# Patient Record
Sex: Male | Born: 1940 | Race: Black or African American | Hispanic: No | Marital: Married | Smoking: Never smoker
Health system: Southern US, Community
[De-identification: ages and names within clinical notes are randomized; demographics above are authoritative.]

## PROBLEM LIST (undated history)

## (undated) DIAGNOSIS — E785 Hyperlipidemia, unspecified: Secondary | ICD-10-CM

## (undated) DIAGNOSIS — I10 Essential (primary) hypertension: Secondary | ICD-10-CM

## (undated) DIAGNOSIS — C801 Malignant (primary) neoplasm, unspecified: Secondary | ICD-10-CM

## (undated) DIAGNOSIS — Z87442 Personal history of urinary calculi: Secondary | ICD-10-CM

## (undated) DIAGNOSIS — I2510 Atherosclerotic heart disease of native coronary artery without angina pectoris: Secondary | ICD-10-CM

## (undated) DIAGNOSIS — E119 Type 2 diabetes mellitus without complications: Secondary | ICD-10-CM

## (undated) DIAGNOSIS — M1990 Unspecified osteoarthritis, unspecified site: Secondary | ICD-10-CM

## (undated) HISTORY — PX: JOINT REPLACEMENT: SHX530

## (undated) HISTORY — PX: EYE SURGERY: SHX253

## (undated) HISTORY — PX: CORONARY ANGIOPLASTY: SHX604

## (undated) HISTORY — PX: TONSILLECTOMY: SUR1361

## (undated) HISTORY — PX: COLON SURGERY: SHX602

## (undated) HISTORY — PX: BACK SURGERY: SHX140

## (undated) HISTORY — DX: Hyperlipidemia, unspecified: E78.5

## (undated) HISTORY — DX: Atherosclerotic heart disease of native coronary artery without angina pectoris: I25.10

## (undated) HISTORY — PX: BLEPHAROPLASTY: SUR158

## (undated) HISTORY — DX: Type 2 diabetes mellitus without complications: E11.9

## (undated) HISTORY — DX: Essential (primary) hypertension: I10

---

## 2014-06-11 DIAGNOSIS — H2513 Age-related nuclear cataract, bilateral: Secondary | ICD-10-CM | POA: Diagnosis not present

## 2014-06-11 DIAGNOSIS — L84 Corns and callosities: Secondary | ICD-10-CM | POA: Diagnosis not present

## 2014-06-11 DIAGNOSIS — I7090 Unspecified atherosclerosis: Secondary | ICD-10-CM | POA: Diagnosis not present

## 2014-06-11 DIAGNOSIS — Z85038 Personal history of other malignant neoplasm of large intestine: Secondary | ICD-10-CM | POA: Diagnosis not present

## 2014-06-11 DIAGNOSIS — B351 Tinea unguium: Secondary | ICD-10-CM | POA: Diagnosis not present

## 2014-06-11 DIAGNOSIS — C182 Malignant neoplasm of ascending colon: Secondary | ICD-10-CM | POA: Diagnosis not present

## 2014-06-11 DIAGNOSIS — E119 Type 2 diabetes mellitus without complications: Secondary | ICD-10-CM | POA: Diagnosis not present

## 2014-06-11 DIAGNOSIS — I798 Other disorders of arteries, arterioles and capillaries in diseases classified elsewhere: Secondary | ICD-10-CM | POA: Diagnosis not present

## 2014-06-11 DIAGNOSIS — H36 Retinal disorders in diseases classified elsewhere: Secondary | ICD-10-CM | POA: Diagnosis not present

## 2014-06-11 DIAGNOSIS — H02413 Mechanical ptosis of bilateral eyelids: Secondary | ICD-10-CM | POA: Diagnosis not present

## 2014-07-04 DIAGNOSIS — R311 Benign essential microscopic hematuria: Secondary | ICD-10-CM | POA: Diagnosis not present

## 2014-07-04 DIAGNOSIS — N400 Enlarged prostate without lower urinary tract symptoms: Secondary | ICD-10-CM | POA: Diagnosis not present

## 2014-07-26 DIAGNOSIS — D485 Neoplasm of uncertain behavior of skin: Secondary | ICD-10-CM | POA: Diagnosis not present

## 2014-07-31 DIAGNOSIS — D1801 Hemangioma of skin and subcutaneous tissue: Secondary | ICD-10-CM | POA: Diagnosis not present

## 2014-09-03 DIAGNOSIS — M25562 Pain in left knee: Secondary | ICD-10-CM | POA: Diagnosis not present

## 2014-09-03 DIAGNOSIS — M0579 Rheumatoid arthritis with rheumatoid factor of multiple sites without organ or systems involvement: Secondary | ICD-10-CM | POA: Diagnosis not present

## 2014-09-03 DIAGNOSIS — D649 Anemia, unspecified: Secondary | ICD-10-CM | POA: Diagnosis not present

## 2014-09-03 DIAGNOSIS — M2560 Stiffness of unspecified joint, not elsewhere classified: Secondary | ICD-10-CM | POA: Diagnosis not present

## 2014-09-03 DIAGNOSIS — M810 Age-related osteoporosis without current pathological fracture: Secondary | ICD-10-CM | POA: Diagnosis not present

## 2014-09-04 DIAGNOSIS — E780 Pure hypercholesterolemia: Secondary | ICD-10-CM | POA: Diagnosis not present

## 2014-09-04 DIAGNOSIS — I119 Hypertensive heart disease without heart failure: Secondary | ICD-10-CM | POA: Diagnosis not present

## 2014-09-04 DIAGNOSIS — Z6832 Body mass index (BMI) 32.0-32.9, adult: Secondary | ICD-10-CM | POA: Diagnosis not present

## 2014-09-04 DIAGNOSIS — E1165 Type 2 diabetes mellitus with hyperglycemia: Secondary | ICD-10-CM | POA: Diagnosis not present

## 2014-09-04 DIAGNOSIS — Z79899 Other long term (current) drug therapy: Secondary | ICD-10-CM | POA: Diagnosis not present

## 2014-09-04 DIAGNOSIS — E119 Type 2 diabetes mellitus without complications: Secondary | ICD-10-CM | POA: Diagnosis not present

## 2014-09-17 DIAGNOSIS — M0579 Rheumatoid arthritis with rheumatoid factor of multiple sites without organ or systems involvement: Secondary | ICD-10-CM | POA: Diagnosis not present

## 2014-09-17 DIAGNOSIS — R5383 Other fatigue: Secondary | ICD-10-CM | POA: Diagnosis not present

## 2014-09-17 DIAGNOSIS — Z79899 Other long term (current) drug therapy: Secondary | ICD-10-CM | POA: Diagnosis not present

## 2014-09-19 DIAGNOSIS — B351 Tinea unguium: Secondary | ICD-10-CM | POA: Diagnosis not present

## 2014-09-19 DIAGNOSIS — L84 Corns and callosities: Secondary | ICD-10-CM | POA: Diagnosis not present

## 2014-09-19 DIAGNOSIS — I798 Other disorders of arteries, arterioles and capillaries in diseases classified elsewhere: Secondary | ICD-10-CM | POA: Diagnosis not present

## 2014-09-19 DIAGNOSIS — I7090 Unspecified atherosclerosis: Secondary | ICD-10-CM | POA: Diagnosis not present

## 2014-12-31 DIAGNOSIS — I798 Other disorders of arteries, arterioles and capillaries in diseases classified elsewhere: Secondary | ICD-10-CM | POA: Diagnosis not present

## 2014-12-31 DIAGNOSIS — R0602 Shortness of breath: Secondary | ICD-10-CM | POA: Diagnosis not present

## 2014-12-31 DIAGNOSIS — B351 Tinea unguium: Secondary | ICD-10-CM | POA: Diagnosis not present

## 2014-12-31 DIAGNOSIS — R9431 Abnormal electrocardiogram [ECG] [EKG]: Secondary | ICD-10-CM | POA: Diagnosis not present

## 2014-12-31 DIAGNOSIS — E784 Other hyperlipidemia: Secondary | ICD-10-CM | POA: Diagnosis not present

## 2014-12-31 DIAGNOSIS — I10 Essential (primary) hypertension: Secondary | ICD-10-CM | POA: Diagnosis not present

## 2014-12-31 DIAGNOSIS — R0789 Other chest pain: Secondary | ICD-10-CM | POA: Diagnosis not present

## 2014-12-31 DIAGNOSIS — L84 Corns and callosities: Secondary | ICD-10-CM | POA: Diagnosis not present

## 2014-12-31 DIAGNOSIS — I7090 Unspecified atherosclerosis: Secondary | ICD-10-CM | POA: Diagnosis not present

## 2014-12-31 DIAGNOSIS — I2510 Atherosclerotic heart disease of native coronary artery without angina pectoris: Secondary | ICD-10-CM | POA: Diagnosis not present

## 2015-01-02 DIAGNOSIS — E1165 Type 2 diabetes mellitus with hyperglycemia: Secondary | ICD-10-CM | POA: Diagnosis not present

## 2015-01-02 DIAGNOSIS — Z6832 Body mass index (BMI) 32.0-32.9, adult: Secondary | ICD-10-CM | POA: Diagnosis not present

## 2015-01-02 DIAGNOSIS — I2510 Atherosclerotic heart disease of native coronary artery without angina pectoris: Secondary | ICD-10-CM | POA: Diagnosis not present

## 2015-01-02 DIAGNOSIS — I119 Hypertensive heart disease without heart failure: Secondary | ICD-10-CM | POA: Diagnosis not present

## 2015-01-02 DIAGNOSIS — G4700 Insomnia, unspecified: Secondary | ICD-10-CM | POA: Diagnosis not present

## 2015-01-02 DIAGNOSIS — E780 Pure hypercholesterolemia: Secondary | ICD-10-CM | POA: Diagnosis not present

## 2015-01-02 DIAGNOSIS — Z79899 Other long term (current) drug therapy: Secondary | ICD-10-CM | POA: Diagnosis not present

## 2015-01-03 DIAGNOSIS — C182 Malignant neoplasm of ascending colon: Secondary | ICD-10-CM | POA: Diagnosis not present

## 2015-01-07 DIAGNOSIS — H36 Retinal disorders in diseases classified elsewhere: Secondary | ICD-10-CM | POA: Diagnosis not present

## 2015-01-07 DIAGNOSIS — E119 Type 2 diabetes mellitus without complications: Secondary | ICD-10-CM | POA: Diagnosis not present

## 2015-01-07 DIAGNOSIS — H02413 Mechanical ptosis of bilateral eyelids: Secondary | ICD-10-CM | POA: Diagnosis not present

## 2015-03-13 DIAGNOSIS — Z23 Encounter for immunization: Secondary | ICD-10-CM | POA: Diagnosis not present

## 2015-03-25 DIAGNOSIS — I798 Other disorders of arteries, arterioles and capillaries in diseases classified elsewhere: Secondary | ICD-10-CM | POA: Diagnosis not present

## 2015-03-25 DIAGNOSIS — M0579 Rheumatoid arthritis with rheumatoid factor of multiple sites without organ or systems involvement: Secondary | ICD-10-CM | POA: Diagnosis not present

## 2015-03-25 DIAGNOSIS — B351 Tinea unguium: Secondary | ICD-10-CM | POA: Diagnosis not present

## 2015-03-25 DIAGNOSIS — M810 Age-related osteoporosis without current pathological fracture: Secondary | ICD-10-CM | POA: Diagnosis not present

## 2015-03-25 DIAGNOSIS — L84 Corns and callosities: Secondary | ICD-10-CM | POA: Diagnosis not present

## 2015-03-25 DIAGNOSIS — K759 Inflammatory liver disease, unspecified: Secondary | ICD-10-CM | POA: Diagnosis not present

## 2015-03-25 DIAGNOSIS — D649 Anemia, unspecified: Secondary | ICD-10-CM | POA: Diagnosis not present

## 2015-03-25 DIAGNOSIS — R5383 Other fatigue: Secondary | ICD-10-CM | POA: Diagnosis not present

## 2015-03-25 DIAGNOSIS — I7090 Unspecified atherosclerosis: Secondary | ICD-10-CM | POA: Diagnosis not present

## 2015-04-09 DIAGNOSIS — R5383 Other fatigue: Secondary | ICD-10-CM | POA: Diagnosis not present

## 2015-04-09 DIAGNOSIS — M0579 Rheumatoid arthritis with rheumatoid factor of multiple sites without organ or systems involvement: Secondary | ICD-10-CM | POA: Diagnosis not present

## 2015-04-09 DIAGNOSIS — I2510 Atherosclerotic heart disease of native coronary artery without angina pectoris: Secondary | ICD-10-CM | POA: Diagnosis not present

## 2015-04-09 DIAGNOSIS — E784 Other hyperlipidemia: Secondary | ICD-10-CM | POA: Diagnosis not present

## 2015-04-09 DIAGNOSIS — I10 Essential (primary) hypertension: Secondary | ICD-10-CM | POA: Diagnosis not present

## 2015-04-09 DIAGNOSIS — R0602 Shortness of breath: Secondary | ICD-10-CM | POA: Diagnosis not present

## 2015-04-09 DIAGNOSIS — R0789 Other chest pain: Secondary | ICD-10-CM | POA: Diagnosis not present

## 2015-04-09 DIAGNOSIS — R9431 Abnormal electrocardiogram [ECG] [EKG]: Secondary | ICD-10-CM | POA: Diagnosis not present

## 2015-04-09 DIAGNOSIS — E559 Vitamin D deficiency, unspecified: Secondary | ICD-10-CM | POA: Diagnosis not present

## 2015-05-08 ENCOUNTER — Encounter: Payer: Self-pay | Admitting: Endocrinology

## 2015-05-08 ENCOUNTER — Ambulatory Visit (INDEPENDENT_AMBULATORY_CARE_PROVIDER_SITE_OTHER): Payer: Medicare Other | Admitting: Endocrinology

## 2015-05-08 VITALS — BP 140/80 | HR 61 | Temp 98.6°F | Ht 74.0 in | Wt 245.0 lb

## 2015-05-08 DIAGNOSIS — E119 Type 2 diabetes mellitus without complications: Secondary | ICD-10-CM | POA: Insufficient documentation

## 2015-05-08 DIAGNOSIS — I2510 Atherosclerotic heart disease of native coronary artery without angina pectoris: Secondary | ICD-10-CM

## 2015-05-08 DIAGNOSIS — E1159 Type 2 diabetes mellitus with other circulatory complications: Secondary | ICD-10-CM | POA: Diagnosis not present

## 2015-05-08 LAB — POCT GLYCOSYLATED HEMOGLOBIN (HGB A1C): Hemoglobin A1C: 10.7

## 2015-05-08 MED ORDER — SITAGLIPTIN PHOSPHATE 100 MG PO TABS: 100.0000 mg | ORAL_TABLET | Freq: Every day | ORAL | Status: DC

## 2015-05-08 MED ORDER — CANAGLIFLOZIN 300 MG PO TABS: 300.0000 mg | ORAL_TABLET | Freq: Every day | ORAL | Status: DC

## 2015-05-08 MED ORDER — METFORMIN HCL ER 500 MG PO TB24: 500.0000 mg | ORAL_TABLET | Freq: Every day | ORAL | Status: DC

## 2015-05-08 MED ORDER — GLIMEPIRIDE 4 MG PO TABS: 4.0000 mg | ORAL_TABLET | Freq: Every day | ORAL | Status: DC

## 2015-05-08 NOTE — Patient Instructions (Addendum)
good diet and exercise significantly improve the control of your diabetes.  please let me know if you wish to be referred to a dietician.  high blood sugar is very risky to your health.  you should see an eye doctor and dentist every year.  It is very important to get all recommended vaccinations.  controlling your blood pressure and cholesterol drastically reduces the damage diabetes does to your body.  Those who smoke should quit.  please discuss these with your doctor.  check your blood sugar once a day.  vary the time of day when you check, between before the 3 meals, and at bedtime.  also check if you have symptoms of your blood sugar being too high or too low.  please keep a record of the readings and bring it to your next appointment here (or you can bring the meter itself).  You can write it on any piece of paper.  please call us sooner if your blood sugar goes below 70, or if you have a lot of readings over 200. please call [phone] ([place] physician referral line), to get an appointment with a new primary doctor.  You get your blood sugar better, we'll need to add several other medications: please add 1 per week:  i have sent prescriptions to your pharmacy, to first add a small amount of exteneded-release metformin.  Then please add glimepiride, invokana, and januvia, adding 1 per week, until your blood sugar come down to the low-100's.   Please see a heart specialist.  you will receive a phone call, about a day and time for an appointment. Please come back for a follow-up appointment in 1 month.

## 2015-05-08 NOTE — Progress Notes (Signed)
Subjective:    Patient ID: Luis Hammond, male    DOB: 06/19/1940, 74 y.o.   MRN: [id number]  HPI pt states DM was dx'ed in 1991; he has mild if any neuropathy of the lower extremities, but he has associated CAD; he has never been on insulin; pt says his diet is good, but exercise is not good; he has never had pancreatitis, severe hypoglycemia or DKA.  He did not tolerate metformin (diarrhea).  Meter is downloaded today, and the printout is scanned into the record.  cbg varies from 170-500.  There is no trend throughout the day.  Past Medical History  Diagnosis Date  . Coronary artery disease   . Hypertension   . Hyperlipidemia   . Diabetes ([place])     No past surgical history on file.  Social History   Social History  . Marital Status: Married    Spouse Name: N/A  . Number of Children: N/A  . Years of Education: N/A   Occupational History  . Not on file.   Social History Main Topics  . Smoking status: Never Smoker   . Smokeless tobacco: Never Used  . Alcohol Use: No  . Drug Use: Not on file  . Sexual Activity: Not on file   Other Topics Concern  . Not on file   Social History Narrative    No current outpatient prescriptions on file prior to visit.   No current facility-administered medications on file prior to visit.    Allergies  Allergen Reactions  . Other Hives and Swelling    Allergen: Onion  . Ivp Dye [Iodinated Diagnostic Agents]   . Penicillins     Family History  Problem Relation Age of Onset  . Diabetes Father   . Diabetes Sister   . Diabetes Brother   . Other Brother     SIGHT ISSUES  . Heart disease Mother   . Dementia Mother   . Diabetes Sister   . Kidney disease Sister   . Diabetes Sister     BP 140/80 mmHg  Pulse 61  Temp(Src) 98.6 F (37 C) (Oral)  Ht 6\' 2"  (1.88 m)  Wt 245 lb (111.131 kg)  BMI 31.44 kg/m2  SpO2 97%  Review of Systems denies weight loss, blurry vision, headache, chest pain, sob, n/v, muscle cramps,  excessive diaphoresis, depression, cold intolerance, rhinorrhea, and easy bruising.  He has urinary frequency.     Objective:   Physical Exam VS: see vs page GEN: no distress HEAD: head: no deformity eyes: no periorbital swelling, no proptosis external nose and ears are normal mouth: no lesion seen NECK: supple, thyroid is not enlarged CHEST WALL: no deformity LUNGS: clear to auscultation BREASTS:  No gynecomastia CV: reg rate and rhythm, no murmur ABD: abdomen is soft, nontender.  no hepatosplenomegaly.  not distended.  no hernia MUSCULOSKELETAL: muscle bulk and strength are grossly normal.  no obvious joint swelling.  gait is normal and steady EXTEMITIES: no deformity.  no ulcer on the feet.  feet are of normal color and temp.  no edema PULSES: dorsalis pedis intact bilat.  no carotid bruit NEURO:  cn 2-12 grossly intact.   readily moves all 4's.  sensation is intact to touch on the feet SKIN:  Normal texture and temperature.  No rash or suspicious lesion is visible.   NODES:  None palpable at the neck.  PSYCH: alert, well-oriented.  Does not appear anxious nor depressed.   A1c=10.7%  i personally reviewed electrocardiogram tracing (  today):  Indication: CAD Impression: Frequent PACs -atrial bigeminy.-Incomplete left bundle branch block. Old inferior infarct.     Assessment & Plan:  DM: severe exacerbation.  He wants to avoid insulin if possible.  I told him he may still need insulin, but we'll try.   Urinary frequency: new to me.  I told pt this will improve as glucose does. CAD: he needs to get established with cardiol.  Dyslipidemia: on rx. HTN: on rx  Patient is advised the following: Patient Instructions  good diet and exercise significantly improve the control of your diabetes.  please let me know if you wish to be referred to a dietician.  high blood sugar is very risky to your health.  you should see an eye doctor and dentist every year.  It is very important to get  all recommended vaccinations.  controlling your blood pressure and cholesterol drastically reduces the damage diabetes does to your body.  Those who smoke should quit.  please discuss these with your doctor.  check your blood sugar once a day.  vary the time of day when you check, between before the 3 meals, and at bedtime.  also check if you have symptoms of your blood sugar being too high or too low.  please keep a record of the readings and bring it to your next appointment here (or you can bring the meter itself).  You can write it on any piece of paper.  please call us sooner if your blood sugar goes below 70, or if you have a lot of readings over 200. please call [phone] ([place] physician referral line), to get an appointment with a new primary doctor.  You get your blood sugar better, we'll need to add several other medications: please add 1 per week:  i have sent prescriptions to your pharmacy, to first add a small amount of exteneded-release metformin.  Then please add glimepiride, invokana, and januvia, adding 1 per week, until your blood sugar come down to the low-100's.   Please see a heart specialist.  you will receive a phone call, about a day and time for an appointment. Please come back for a follow-up appointment in 1 month.

## 2015-05-09 ENCOUNTER — Ambulatory Visit (INDEPENDENT_AMBULATORY_CARE_PROVIDER_SITE_OTHER): Payer: Medicare Other | Admitting: Cardiovascular Disease

## 2015-05-09 ENCOUNTER — Encounter: Payer: Self-pay | Admitting: Cardiovascular Disease

## 2015-05-09 VITALS — BP 140/82 | HR 64 | Ht 74.0 in | Wt 247.3 lb

## 2015-05-09 DIAGNOSIS — I2510 Atherosclerotic heart disease of native coronary artery without angina pectoris: Secondary | ICD-10-CM | POA: Diagnosis not present

## 2015-05-09 DIAGNOSIS — Z79899 Other long term (current) drug therapy: Secondary | ICD-10-CM

## 2015-05-09 DIAGNOSIS — E785 Hyperlipidemia, unspecified: Secondary | ICD-10-CM | POA: Insufficient documentation

## 2015-05-09 DIAGNOSIS — I10 Essential (primary) hypertension: Secondary | ICD-10-CM | POA: Insufficient documentation

## 2015-05-09 NOTE — Patient Instructions (Signed)
Medication Instructions:  Please continue your current medications  Labwork: Your physician recommends that you return for lab work in at your earliest [place].  Testing/Procedures: NONE  Follow-Up: Dr Gwenlyn Found recommends that you schedule a follow-up appointment in 1 year. You will receive a reminder letter in the mail two months in advance. If you don't receive a letter, please call our office to schedule the follow-up appointment.  If you need a refill on your cardiac medications before your next appointment, please call your pharmacy.

## 2015-05-09 NOTE — Assessment & Plan Note (Addendum)
History of CAD status post myocardial infarction and stenting X 2 in 2001 in [place]. He has had a workup by his cardiologist as recently as last month with a 2-D echo and a stress test within the last year. He denies chest pain or shortness of breath.

## 2015-05-09 NOTE — Assessment & Plan Note (Signed)
History of hyperlipidemia, statin intolerant on Zetia. We will recheck a lipid and liver profile

## 2015-05-09 NOTE — Progress Notes (Signed)
05/09/2015 Luis Hammond   February 10, 1941  [id number]  Primary Physician No primary care provider on file. Primary Cardiologist: Luis Harp MD Luis Hammond   HPI:  [name] is a 74 year old moderately overweight married African-American male father of 2, grandfather of 5 grandchildren referred by Dr. Loanne Hammond for cardiovascular evaluation. He recently relocated [place] one year ago. 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 His cardiac risk factor profile is notable for treated hypertension, hypokalemia and diabetes. He did have a myocardial infarction 2001 and had stenting at that time. He has never had a stroke. His problems include rheumatoid arthritis. He denies chest pain or shortness of breath.   Current Outpatient Prescriptions  Medication Sig Dispense Refill  . amLODipine (NORVASC) 10 MG tablet Take 10 mg by mouth daily.    Marland Kitchen aspirin 81 MG tablet Take 1 mg by mouth daily.    . canagliflozin (INVOKANA) 300 MG TABS tablet Take 300 mg by mouth daily. 30 tablet 11  . Cholecalciferol (VITAMIN D-3) 1000 UNITS CAPS Take 2,000 Units by mouth daily.     Marland Kitchen ezetimibe (ZETIA) 10 MG tablet Take 10 mg by mouth daily.    . ferrous sulfate 325 (65 FE) MG tablet Take 325 mg by mouth daily with breakfast.    . folic acid (FOLVITE) 1 MG tablet Take 1 mg by mouth daily.    Marland Kitchen glimepiride (AMARYL) 4 MG tablet Take 1 tablet (4 mg total) by mouth daily before breakfast. 30 tablet 3  . losartan (COZAAR) 100 MG tablet Take 100 mg by mouth daily.    . metFORMIN (GLUCOPHAGE-XR) 500 MG 24 hr tablet Take 1 tablet (500 mg total) by mouth daily with breakfast. 30 tablet 11  . methotrexate (RHEUMATREX) 2.5 MG tablet Take 2.5 mg by mouth once a week. Caution:Chemotherapy. Protect from light.    . metoprolol succinate (TOPROL-XL) 25 MG 24 hr tablet Take 25 mg by mouth daily.    . pioglitazone (ACTOS) 30 MG tablet Take 30 mg by mouth daily.    . sitaGLIPtin (JANUVIA) 100 MG  tablet Take 1 tablet (100 mg total) by mouth daily. 30 tablet 1   No current facility-administered medications for this visit.    Allergies  Allergen Reactions  . Other Hives and Swelling    Allergen: Onion  . Ivp Dye [Iodinated Diagnostic Agents]   . Penicillins     Social History   Social History  . Marital Status: Married    Spouse Name: N/A  . Number of Children: N/A  . Years of Education: N/A   Occupational History  . Not on file.   Social History Main Topics  . Smoking status: Never Smoker   . Smokeless tobacco: Never Used  . Alcohol Use: No  . Drug Use: Not on file  . Sexual Activity: Not on file   Other Topics Concern  . Not on file   Social History Narrative     Review of Systems: General: negative for chills, fever, night sweats or weight changes.  Cardiovascular: negative for chest pain, dyspnea on exertion, edema, orthopnea, palpitations, paroxysmal nocturnal dyspnea or shortness of breath Dermatological: negative for rash Respiratory: negative for cough or wheezing Urologic: negative for hematuria Abdominal: negative for nausea, vomiting, diarrhea, bright red blood per rectum, melena, or hematemesis Neurologic: negative for visual changes, syncope, or dizziness All other systems reviewed and are otherwise negative except as noted above.    Blood pressure 140/82, pulse  64, height 6\' 2"  (1.88 m), weight 247 lb 4.8 oz (112.175 kg).  General appearance: alert and no distress Neck: no adenopathy, no carotid bruit, no JVD, supple, symmetrical, trachea midline and thyroid not enlarged, symmetric, no tenderness/mass/nodules Lungs: clear to auscultation bilaterally Heart: regular rate and rhythm, S1, S2 normal, no murmur, click, rub or gallop Extremities: extremities normal, atraumatic, no cyanosis or edema  EKG normal sinus rhythm at 64 with sinus arrhythmia. I personally reviewed this EKG  ASSESSMENT AND PLAN:   Hyperlipidemia History of  hyperlipidemia, statin intolerant on Zetia. We will recheck a lipid and liver profile  Essential hypertension History of hypertension blood pressure measured today 140/82. He is on amlodipine, losartan and metoprolol. Continue current meds at current dosing  CAD (coronary artery disease) History of CAD status post myocardial infarction and stenting X 2 in 2001 in [place]. He has had a workup by his cardiologist as recently as last month with a 2-D echo and a stress test within the last year. He denies chest pain or shortness of breath.      Luis Harp MD FACP,FACC,FAHA, [facility] 05/09/2015 4:29 PM

## 2015-05-09 NOTE — Assessment & Plan Note (Signed)
History of hypertension blood pressure measured today 140/82. He is on amlodipine, losartan and metoprolol. Continue current meds at current dosing

## 2015-05-10 ENCOUNTER — Encounter: Payer: Self-pay | Admitting: Cardiovascular Disease

## 2015-05-10 DIAGNOSIS — Z7689 Persons encountering health services in other specified circumstances: Secondary | ICD-10-CM

## 2015-05-14 ENCOUNTER — Telehealth: Payer: Self-pay | Admitting: Endocrinology

## 2015-05-14 DIAGNOSIS — Z7689 Persons encountering health services in other specified circumstances: Secondary | ICD-10-CM

## 2015-05-14 MED ORDER — EMPAGLIFLOZIN 25 MG PO TABS: 25.0000 mg | ORAL_TABLET | Freq: Every day | ORAL | Status: DC

## 2015-05-14 NOTE — Telephone Encounter (Signed)
please call patient: Ins wants you to change invokana to jardiance. i have sent a prescription to your pharmacy

## 2015-05-15 NOTE — Telephone Encounter (Signed)
Left a voicemail advising of note below. Requested a call back if the pt would like to discuss.  

## 2015-05-17 ENCOUNTER — Ambulatory Visit: Payer: Medicare Other | Admitting: Endocrinology

## 2015-06-03 DIAGNOSIS — Z79899 Other long term (current) drug therapy: Secondary | ICD-10-CM | POA: Diagnosis not present

## 2015-06-03 DIAGNOSIS — E785 Hyperlipidemia, unspecified: Secondary | ICD-10-CM | POA: Diagnosis not present

## 2015-06-04 LAB — HEPATIC FUNCTION PANEL
ALK PHOS: 47 U/L (ref 40–115)
ALT: 17 U/L (ref 9–46)
AST: 18 U/L (ref 10–35)
Albumin: 4.3 g/dL (ref 3.6–5.1)
BILIRUBIN DIRECT: 0.1 mg/dL (ref <=0.2)
BILIRUBIN TOTAL: 0.3 mg/dL (ref 0.2–1.2)
Indirect Bilirubin: 0.2 mg/dL (ref 0.2–1.2)
Total Protein: 6.4 g/dL (ref 6.1–8.1)

## 2015-06-04 LAB — LIPID PANEL
CHOL/HDL RATIO: 3.5 ratio (ref <=5.0)
CHOLESTEROL: 139 mg/dL (ref 125–200)
HDL: 40 mg/dL (ref >=40)
LDL Cholesterol: 71 mg/dL (ref <130)
Triglycerides: 140 mg/dL (ref <150)
VLDL: 28 mg/dL (ref <30)

## 2015-06-07 ENCOUNTER — Ambulatory Visit (INDEPENDENT_AMBULATORY_CARE_PROVIDER_SITE_OTHER): Payer: Medicare Other | Admitting: Endocrinology

## 2015-06-07 ENCOUNTER — Encounter: Payer: Self-pay | Admitting: Endocrinology

## 2015-06-07 VITALS — BP 144/94 | HR 55 | Temp 98.2°F | Ht 74.0 in | Wt 248.0 lb

## 2015-06-07 DIAGNOSIS — E1159 Type 2 diabetes mellitus with other circulatory complications: Secondary | ICD-10-CM | POA: Diagnosis not present

## 2015-06-07 LAB — MICROALBUMIN / CREATININE URINE RATIO
Creatinine,U: 96.8 mg/dL
MICROALB/CREAT RATIO: 0.7 mg/g (ref 0.0–30.0)

## 2015-06-07 LAB — BASIC METABOLIC PANEL
BUN: 16 mg/dL (ref 6–23)
CALCIUM: 9.4 mg/dL (ref 8.4–10.5)
CO2: 28 meq/L (ref 19–32)
Chloride: 109 mEq/L (ref 96–112)
Creatinine, Ser: 1.1 mg/dL (ref 0.40–1.50)
GFR: 84 mL/min (ref >60.00)
Glucose, Bld: 95 mg/dL (ref 70–99)
POTASSIUM: 4.4 meq/L (ref 3.5–5.1)
SODIUM: 142 meq/L (ref 135–145)

## 2015-06-07 MED ORDER — METFORMIN HCL ER 500 MG PO TB24: 500.0000 mg | ORAL_TABLET | Freq: Every day | ORAL | Status: DC

## 2015-06-07 NOTE — Progress Notes (Signed)
Subjective:    Patient ID: Luis Hammond, male    DOB: November 06, 1940, 75 y.o.   MRN: [id number]  HPI Pt returns for f/u of diabetes mellitus: DM type: 2 Dx'ed: 99991111 Complications: CAD Therapy: 5 oral meds DKA: never Severe hypoglycemia: never Pancreatitis: never Other: he has never been on insulin Interval history: no cbg record, but states cbg's vary from 86-150.  pt states he feels well in general, except for a slight headache. He feels as though he needs to eat at hs, in order to avoid nocturnal hypoglycemia.  He has slight diarrhea.  Past Medical History  Diagnosis Date  . Coronary artery disease   . Hypertension   . Hyperlipidemia   . Diabetes ([place])     No past surgical history on file.  Social History   Social History  . Marital Status: Married    Spouse Name: N/A  . Number of Children: N/A  . Years of Education: N/A   Occupational History  . Not on file.   Social History Main Topics  . Smoking status: Never Smoker   . Smokeless tobacco: Never Used  . Alcohol Use: No  . Drug Use: Not on file  . Sexual Activity: Not on file   Other Topics Concern  . Not on file   Social History Narrative   Epworth Sleepiness Scale - 7 (as of 05/09/15)    Current Outpatient Prescriptions on File Prior to Visit  Medication Sig Dispense Refill  . amLODipine (NORVASC) 10 MG tablet Take 10 mg by mouth daily.    Marland Kitchen aspirin 81 MG tablet Take 1 mg by mouth daily.    . Cholecalciferol (VITAMIN D-3) 1000 UNITS CAPS Take 2,000 Units by mouth daily.     Marland Kitchen ezetimibe (ZETIA) 10 MG tablet Take 10 mg by mouth daily.    . ferrous sulfate 325 (65 FE) MG tablet Take 325 mg by mouth daily with breakfast.    . folic acid (FOLVITE) 1 MG tablet Take 1 mg by mouth daily.    Marland Kitchen losartan (COZAAR) 100 MG tablet Take 100 mg by mouth daily.    . methotrexate (RHEUMATREX) 2.5 MG tablet Take 2.5 mg by mouth once a week. Caution:Chemotherapy. Protect from light.    . metoprolol succinate (TOPROL-XL)  25 MG 24 hr tablet Take 25 mg by mouth daily.     No current facility-administered medications on file prior to visit.    Allergies  Allergen Reactions  . Other Hives and Swelling    Allergen: Onion  . Ivp Dye [Iodinated Diagnostic Agents]   . Penicillins     Family History  Problem Relation Age of Onset  . Diabetes Father   . Diabetes Sister   . Diabetes Brother   . Other Brother     SIGHT ISSUES  . Heart disease Mother   . Dementia Mother   . Diabetes Sister   . Kidney disease Sister   . Diabetes Sister     BP 144/94 mmHg  Pulse 55  Temp(Src) 98.2 F (36.8 C) (Oral)  Ht 6\' 2"  (1.88 m)  Wt 248 lb (112.492 kg)  BMI 31.83 kg/m2  SpO2 98%  Review of Systems He denies hypoglycemia    Objective:   Physical Exam VITAL SIGNS:  See vs page GENERAL: no distress Ext: no edema.         Assessment & Plan:  DM: control is apparently improved.  We'll check fructosamine to verify. Diarrhea: we can't increase metformin.  Patient is advised the following: Patient Instructions  check your blood sugar once a day.  vary the time of day when you check, between before the 3 meals, and at bedtime.  also check if you have symptoms of your blood sugar being too high or too low.  please keep a record of the readings and bring it to your next appointment here (or you can bring the meter itself).  You can write it on any piece of paper.  please call us sooner if your blood sugar goes below 70, or if you have a lot of readings over 200. please call [phone] ([place] physician referral line), to get an appointment with a new primary doctor.   blood tests are requested for you today.  We'll let you know about the results.  Please come back for a follow-up appointment in 2 months.

## 2015-06-07 NOTE — Patient Instructions (Addendum)
check your blood sugar once a day.  vary the time of day when you check, between before the 3 meals, and at bedtime.  also check if you have symptoms of your blood sugar being too high or too low.  please keep a record of the readings and bring it to your next appointment here (or you can bring the meter itself).  You can write it on any piece of paper.  please call us sooner if your blood sugar goes below 70, or if you have a lot of readings over 200. please call [phone] ([place] physician referral line), to get an appointment with a new primary doctor.   blood tests are requested for you today.  We'll let you know about the results.  Please come back for a follow-up appointment in 2 months.

## 2015-06-08 MED ORDER — EMPAGLIFLOZIN 25 MG PO TABS: 25.0000 mg | ORAL_TABLET | Freq: Every day | ORAL | Status: AC

## 2015-06-08 MED ORDER — PIOGLITAZONE HCL 30 MG PO TABS: 30.0000 mg | ORAL_TABLET | Freq: Every day | ORAL | Status: DC

## 2015-06-08 MED ORDER — GLIMEPIRIDE 4 MG PO TABS: 4.0000 mg | ORAL_TABLET | Freq: Every day | ORAL | Status: DC

## 2015-06-08 MED ORDER — SITAGLIPTIN PHOSPHATE 100 MG PO TABS: 100.0000 mg | ORAL_TABLET | Freq: Every day | ORAL | Status: DC

## 2015-06-10 ENCOUNTER — Other Ambulatory Visit: Payer: Self-pay | Admitting: Endocrinology

## 2015-06-10 LAB — FRUCTOSAMINE: Fructosamine: 274 umol/L — ABNORMAL HIGH (ref 190–270)

## 2015-06-10 MED ORDER — GLIMEPIRIDE 2 MG PO TABS: 2.0000 mg | ORAL_TABLET | Freq: Every day | ORAL | Status: DC

## 2015-06-13 ENCOUNTER — Telehealth: Payer: Self-pay | Admitting: Cardiovascular Disease

## 2015-06-13 NOTE — Telephone Encounter (Signed)
Mr.Jeansonne is calling about his test results. Please call   Thanks

## 2015-06-13 NOTE — Telephone Encounter (Signed)
Pt informed of results and voiced understanding. No further concerns at this time. Advised to call if any other needs.

## 2015-06-20 ENCOUNTER — Telehealth: Payer: Self-pay

## 2015-06-20 NOTE — Telephone Encounter (Signed)
Faxed records release to [facility] (Dr Reece Leader) in [place]

## 2015-07-15 DIAGNOSIS — N400 Enlarged prostate without lower urinary tract symptoms: Secondary | ICD-10-CM | POA: Diagnosis not present

## 2015-07-15 DIAGNOSIS — R311 Benign essential microscopic hematuria: Secondary | ICD-10-CM | POA: Diagnosis not present

## 2015-07-15 DIAGNOSIS — C182 Malignant neoplasm of ascending colon: Secondary | ICD-10-CM | POA: Diagnosis not present

## 2015-07-16 DIAGNOSIS — I7090 Unspecified atherosclerosis: Secondary | ICD-10-CM | POA: Diagnosis not present

## 2015-07-16 DIAGNOSIS — I798 Other disorders of arteries, arterioles and capillaries in diseases classified elsewhere: Secondary | ICD-10-CM | POA: Diagnosis not present

## 2015-07-16 DIAGNOSIS — L84 Corns and callosities: Secondary | ICD-10-CM | POA: Diagnosis not present

## 2015-07-16 DIAGNOSIS — B351 Tinea unguium: Secondary | ICD-10-CM | POA: Diagnosis not present

## 2015-07-24 ENCOUNTER — Other Ambulatory Visit: Payer: Self-pay | Admitting: Cardiovascular Disease

## 2015-07-24 MED ORDER — AMLODIPINE BESYLATE 10 MG PO TABS: 10.0000 mg | ORAL_TABLET | Freq: Every day | ORAL | Status: AC

## 2015-07-24 MED ORDER — LOSARTAN POTASSIUM 100 MG PO TABS: 100.0000 mg | ORAL_TABLET | Freq: Every day | ORAL | Status: AC

## 2015-07-24 MED ORDER — EZETIMIBE 10 MG PO TABS: 10.0000 mg | ORAL_TABLET | Freq: Every day | ORAL | Status: AC

## 2015-07-24 MED ORDER — METOPROLOL SUCCINATE ER 25 MG PO TB24: 25.0000 mg | ORAL_TABLET | Freq: Every day | ORAL | Status: AC

## 2015-07-24 NOTE — Telephone Encounter (Signed)
Rx(s) sent to pharmacy electronically.  

## 2015-07-24 NOTE — Telephone Encounter (Signed)
Pt came in Monday and fill out the yellow sheet to get his medicine refilled. He can not remember the names of the medicine. He said they still have not been called in. He is out of his medicine,can you please it in today.Please call him,when this have been called in.

## 2015-08-04 NOTE — Patient Instructions (Addendum)
check your blood sugar once a day.  vary the time of day when you check, between before the 3 meals, and at bedtime.  also check if you have symptoms of your blood sugar being too high or too low.  please keep a record of the readings and bring it to your next appointment here (or you can bring the meter itself).  You can write it on any piece of paper.  please call us sooner if your blood sugar goes below 70, or if you have a lot of readings over 200. please call [phone] ( physician referral line), to get an appointment with a new primary doctor.   You can stay off the metformin.   Please reduce the glimepiride to 1 mg daily Please come back for a follow-up appointment in 4 months.

## 2015-08-04 NOTE — Progress Notes (Signed)
Subjective:    Patient ID: Luis Hammond, male    DOB: 1941-03-04, 75 y.o.   MRN: [id number]  HPI Pt returns for f/u of diabetes mellitus: DM type: 2 Dx'ed: 99991111 Complications: CAD Therapy: 5 oral meds DKA: never Severe hypoglycemia: never Pancreatitis: never.   Other: he took insulin for a brief time in approx 2007, so he knows how; pioglitizone rx is limited by edema Interval history: no cbg record, but states cbg's vary from 69-100's.  pt states he feels well in general, except for intermittent diarrhea. he stopped metformin 1 week ago, and diarrhea has resolved.   Past Medical History  Diagnosis Date  . Coronary artery disease   . Hypertension   . Hyperlipidemia   . Diabetes ([place])     No past surgical history on file.  Social History   Social History  . Marital Status: Married    Spouse Name: N/A  . Number of Children: N/A  . Years of Education: N/A   Occupational History  . Not on file.   Social History Main Topics  . Smoking status: Never Smoker   . Smokeless tobacco: Never Used  . Alcohol Use: No  . Drug Use: Not on file  . Sexual Activity: Not on file   Other Topics Concern  . Not on file   Social History Narrative   Epworth Sleepiness Scale - 7 (as of 05/09/15)    Current Outpatient Prescriptions on File Prior to Visit  Medication Sig Dispense Refill  . amLODipine (NORVASC) 10 MG tablet Take 1 tablet (10 mg total) by mouth daily. 90 tablet 2  . aspirin 81 MG tablet Take 1 mg by mouth daily.    . Cholecalciferol (VITAMIN D-3) 1000 UNITS CAPS Take 2,000 Units by mouth daily.     . empagliflozin (JARDIANCE) 25 MG TABS tablet Take 25 mg by mouth daily. 90 tablet 3  . ezetimibe (ZETIA) 10 MG tablet Take 1 tablet (10 mg total) by mouth daily. 90 tablet 2  . ferrous sulfate 325 (65 FE) MG tablet Take 325 mg by mouth daily with breakfast.    . folic acid (FOLVITE) 1 MG tablet Take 1 mg by mouth daily.    Marland Kitchen losartan (COZAAR) 100 MG tablet Take 1 tablet  (100 mg total) by mouth daily. 90 tablet 2  . methotrexate (RHEUMATREX) 2.5 MG tablet Take 2.5 mg by mouth once a week. Caution:Chemotherapy. Protect from light.    . metoprolol succinate (TOPROL-XL) 25 MG 24 hr tablet Take 1 tablet (25 mg total) by mouth daily. 90 tablet 2  . pioglitazone (ACTOS) 30 MG tablet Take 1 tablet (30 mg total) by mouth daily. 90 tablet 3  . sitaGLIPtin (JANUVIA) 100 MG tablet Take 1 tablet (100 mg total) by mouth daily. 90 tablet 3   No current facility-administered medications on file prior to visit.    Allergies  Allergen Reactions  . Other Hives and Swelling    Allergen: Onion  . Ivp Dye [Iodinated Diagnostic Agents]   . Penicillins     Family History  Problem Relation Age of Onset  . Diabetes Father   . Diabetes Sister   . Diabetes Brother   . Other Brother     SIGHT ISSUES  . Heart disease Mother   . Dementia Mother   . Diabetes Sister   . Kidney disease Sister   . Diabetes Sister     BP 114/62 mmHg  Pulse 55  Temp(Src) 97.9 F (36.6 C) (Oral)  Resp 12  Wt 248 lb 9.6 oz (112.764 kg)  SpO2 98%  Review of Systems He denies hypoglycemia.      Objective:   Physical Exam VITAL SIGNS:  See vs page GENERAL: no distress Pulses: dorsalis pedis intact bilat.   MSK: no deformity of the feet CV: trace bilat leg edema Skin:  no ulcer on the feet.  normal color and temp on the feet. Neuro: sensation is intact to touch on the feet Ext: There is bilateral onychomycosis of the toenails.    A1c=6.9%    Assessment & Plan:  DM: worse, but this is the best control this pt should aim for, given this SU-containing regimen.  Diarrhea: this precludes metformin use Edema: this limits pioglitizone dosage.  We may have to reduce in the future.   Patient is advised the following: Patient Instructions  check your blood sugar once a day.  vary the time of day when you check, between before the 3 meals, and at bedtime.  also check if you have symptoms  of your blood sugar being too high or too low.  please keep a record of the readings and bring it to your next appointment here (or you can bring the meter itself).  You can write it on any piece of paper.  please call us sooner if your blood sugar goes below 70, or if you have a lot of readings over 200. please call [phone] ([place] physician referral line), to get an appointment with a new primary doctor.   You can stay off the metformin.   Please reduce the glimepiride to 1 mg daily Please come back for a follow-up appointment in 4 months.

## 2015-08-05 ENCOUNTER — Encounter: Payer: Self-pay | Admitting: Endocrinology

## 2015-08-05 ENCOUNTER — Ambulatory Visit (INDEPENDENT_AMBULATORY_CARE_PROVIDER_SITE_OTHER): Payer: Medicare Other | Admitting: Endocrinology

## 2015-08-05 ENCOUNTER — Other Ambulatory Visit (INDEPENDENT_AMBULATORY_CARE_PROVIDER_SITE_OTHER): Payer: Medicare Other | Admitting: *Deleted

## 2015-08-05 VITALS — BP 114/62 | HR 55 | Temp 97.9°F | Resp 12 | Wt 248.6 lb

## 2015-08-05 DIAGNOSIS — E1159 Type 2 diabetes mellitus with other circulatory complications: Secondary | ICD-10-CM

## 2015-08-05 LAB — POCT GLYCOSYLATED HEMOGLOBIN (HGB A1C): HEMOGLOBIN A1C: 6.9

## 2015-08-05 MED ORDER — GLIMEPIRIDE 1 MG PO TABS: 1.0000 mg | ORAL_TABLET | Freq: Every day | ORAL | Status: DC

## 2015-09-16 ENCOUNTER — Telehealth: Payer: Self-pay | Admitting: Endocrinology

## 2015-09-16 MED ORDER — SITAGLIPTIN PHOSPHATE 100 MG PO TABS: 100.0000 mg | ORAL_TABLET | Freq: Every day | ORAL | Status: DC

## 2015-09-16 NOTE — Telephone Encounter (Signed)
PT needs his Januvia refilled sent to [facility] for a 90 days supply

## 2015-09-16 NOTE — Telephone Encounter (Signed)
Rx submitted per pt's request.  

## 2015-09-18 ENCOUNTER — Other Ambulatory Visit: Payer: Self-pay | Admitting: Gastroenterology

## 2015-09-24 DIAGNOSIS — E559 Vitamin D deficiency, unspecified: Secondary | ICD-10-CM | POA: Diagnosis not present

## 2015-09-24 DIAGNOSIS — Z79899 Other long term (current) drug therapy: Secondary | ICD-10-CM | POA: Diagnosis not present

## 2015-09-24 DIAGNOSIS — E7800 Pure hypercholesterolemia, unspecified: Secondary | ICD-10-CM | POA: Diagnosis not present

## 2015-09-24 DIAGNOSIS — I7090 Unspecified atherosclerosis: Secondary | ICD-10-CM | POA: Diagnosis not present

## 2015-09-24 DIAGNOSIS — E119 Type 2 diabetes mellitus without complications: Secondary | ICD-10-CM | POA: Diagnosis not present

## 2015-09-24 DIAGNOSIS — D500 Iron deficiency anemia secondary to blood loss (chronic): Secondary | ICD-10-CM | POA: Diagnosis not present

## 2015-09-24 DIAGNOSIS — I119 Hypertensive heart disease without heart failure: Secondary | ICD-10-CM | POA: Diagnosis not present

## 2015-09-24 DIAGNOSIS — I2510 Atherosclerotic heart disease of native coronary artery without angina pectoris: Secondary | ICD-10-CM | POA: Diagnosis not present

## 2015-09-24 DIAGNOSIS — M059 Rheumatoid arthritis with rheumatoid factor, unspecified: Secondary | ICD-10-CM | POA: Diagnosis not present

## 2015-09-24 DIAGNOSIS — B351 Tinea unguium: Secondary | ICD-10-CM | POA: Diagnosis not present

## 2015-09-24 DIAGNOSIS — E1165 Type 2 diabetes mellitus with hyperglycemia: Secondary | ICD-10-CM | POA: Diagnosis not present

## 2015-09-24 DIAGNOSIS — I798 Other disorders of arteries, arterioles and capillaries in diseases classified elsewhere: Secondary | ICD-10-CM | POA: Diagnosis not present

## 2015-09-24 DIAGNOSIS — L84 Corns and callosities: Secondary | ICD-10-CM | POA: Diagnosis not present

## 2015-09-25 DIAGNOSIS — M0579 Rheumatoid arthritis with rheumatoid factor of multiple sites without organ or systems involvement: Secondary | ICD-10-CM | POA: Diagnosis not present

## 2015-10-04 ENCOUNTER — Encounter (HOSPITAL_COMMUNITY): Payer: Self-pay | Admitting: *Deleted

## 2015-10-07 DIAGNOSIS — D649 Anemia, unspecified: Secondary | ICD-10-CM | POA: Diagnosis not present

## 2015-10-07 DIAGNOSIS — K759 Inflammatory liver disease, unspecified: Secondary | ICD-10-CM | POA: Diagnosis not present

## 2015-10-07 DIAGNOSIS — E559 Vitamin D deficiency, unspecified: Secondary | ICD-10-CM | POA: Diagnosis not present

## 2015-10-07 DIAGNOSIS — M0579 Rheumatoid arthritis with rheumatoid factor of multiple sites without organ or systems involvement: Secondary | ICD-10-CM | POA: Diagnosis not present

## 2015-10-07 DIAGNOSIS — R5383 Other fatigue: Secondary | ICD-10-CM | POA: Diagnosis not present

## 2015-10-14 ENCOUNTER — Encounter (HOSPITAL_COMMUNITY)
Admission: RE | Disposition: A | Discharge status: Home / Self Care | Payer: Self-pay | Source: Ambulatory Visit | Attending: Gastroenterology

## 2015-10-14 ENCOUNTER — Ambulatory Visit (HOSPITAL_COMMUNITY)
Admission: RE | Admit: 2015-10-14 | Discharge: 2015-10-14 | Disposition: A | Discharge status: Home / Self Care | Payer: Medicare Other | Source: Ambulatory Visit | Attending: Gastroenterology | Admitting: Gastroenterology

## 2015-10-14 ENCOUNTER — Ambulatory Visit (HOSPITAL_COMMUNITY): Payer: Medicare Other | Admitting: Anesthesiology

## 2015-10-14 ENCOUNTER — Encounter (HOSPITAL_COMMUNITY): Payer: Self-pay

## 2015-10-14 DIAGNOSIS — E119 Type 2 diabetes mellitus without complications: Secondary | ICD-10-CM | POA: Insufficient documentation

## 2015-10-14 DIAGNOSIS — Z86010 Personal history of colonic polyps: Secondary | ICD-10-CM | POA: Diagnosis not present

## 2015-10-14 DIAGNOSIS — Z85038 Personal history of other malignant neoplasm of large intestine: Secondary | ICD-10-CM | POA: Diagnosis not present

## 2015-10-14 DIAGNOSIS — Z9049 Acquired absence of other specified parts of digestive tract: Secondary | ICD-10-CM | POA: Diagnosis not present

## 2015-10-14 DIAGNOSIS — Z7984 Long term (current) use of oral hypoglycemic drugs: Secondary | ICD-10-CM | POA: Diagnosis not present

## 2015-10-14 DIAGNOSIS — Z955 Presence of coronary angioplasty implant and graft: Secondary | ICD-10-CM | POA: Insufficient documentation

## 2015-10-14 DIAGNOSIS — Z1211 Encounter for screening for malignant neoplasm of colon: Secondary | ICD-10-CM | POA: Insufficient documentation

## 2015-10-14 DIAGNOSIS — Z79899 Other long term (current) drug therapy: Secondary | ICD-10-CM | POA: Insufficient documentation

## 2015-10-14 DIAGNOSIS — I10 Essential (primary) hypertension: Secondary | ICD-10-CM | POA: Diagnosis not present

## 2015-10-14 DIAGNOSIS — I2510 Atherosclerotic heart disease of native coronary artery without angina pectoris: Secondary | ICD-10-CM | POA: Diagnosis not present

## 2015-10-14 HISTORY — DX: Malignant (primary) neoplasm, unspecified: C80.1

## 2015-10-14 HISTORY — DX: Personal history of urinary calculi: Z87.442

## 2015-10-14 HISTORY — PX: COLONOSCOPY WITH PROPOFOL: SHX5780

## 2015-10-14 HISTORY — DX: Unspecified osteoarthritis, unspecified site: M19.90

## 2015-10-14 LAB — GLUCOSE, CAPILLARY: Glucose-Capillary: 110 mg/dL — ABNORMAL HIGH (ref 65–99)

## 2015-10-14 SURGERY — COLONOSCOPY WITH PROPOFOL
Anesthesia: Monitor Anesthesia Care

## 2015-10-14 MED ORDER — PROPOFOL 500 MG/50ML IV EMUL
INTRAVENOUS | Status: DC | PRN
  Administered 2015-10-14: 100 ug/kg/min via INTRAVENOUS

## 2015-10-14 MED ORDER — PROPOFOL 10 MG/ML IV BOLUS
INTRAVENOUS | Status: AC
  Filled 2015-10-14: qty 20

## 2015-10-14 MED ORDER — SODIUM CHLORIDE 0.9 % IV SOLN: INTRAVENOUS | Status: DC

## 2015-10-14 MED ORDER — PROPOFOL 10 MG/ML IV BOLUS
INTRAVENOUS | Status: DC | PRN
  Administered 2015-10-14 (×2): 20 mg via INTRAVENOUS
  Administered 2015-10-14: 50 mg via INTRAVENOUS

## 2015-10-14 MED ORDER — LACTATED RINGERS IV SOLN
INTRAVENOUS | Status: DC | PRN
  Administered 2015-10-14: 09:00:00 via INTRAVENOUS

## 2015-10-14 SURGICAL SUPPLY — 21 items

## 2015-10-14 NOTE — Transfer of Care (Signed)
Immediate Anesthesia Transfer of Care Note  Patient: Luis Hammond  Procedure(s) Performed: Procedure(s): COLONOSCOPY WITH PROPOFOL (N/A)  Patient Location: PACU  Anesthesia Type:MAC  Level of Consciousness:  sedated, patient cooperative and responds to stimulation  Airway & Oxygen Therapy:Patient Spontanous Breathing and Patient connected to face mask oxgen  Post-op Assessment:  Report given to PACU RN and Post -op Vital signs reviewed and stable  Post vital signs:  Reviewed and stable  Last Vitals:  Filed Vitals:   10/14/15 0801  BP: 168/71  Pulse: 56  Temp: 36.7 C  Resp: 17    Complications: No apparent anesthesia complications

## 2015-10-14 NOTE — Anesthesia Postprocedure Evaluation (Signed)
Anesthesia Post Note  Patient: Luis Hammond  Procedure(s) Performed: Procedure(s) (LRB): COLONOSCOPY WITH PROPOFOL (N/A)  Patient location during evaluation: PACU Anesthesia Type: MAC Level of consciousness: awake and alert Pain management: pain level controlled Vital Signs Assessment: post-procedure vital signs reviewed and stable Respiratory status: spontaneous breathing, nonlabored ventilation, respiratory function stable and patient connected to nasal cannula oxygen Cardiovascular status: blood pressure returned to baseline and stable Postop Assessment: no signs of nausea or vomiting Anesthetic complications: no    Last Vitals:  Filed Vitals:   10/14/15 1008 10/14/15 1010  BP: 112/40 112/40  Pulse:  45  Temp:    Resp:  19    Last Pain: There were no vitals filed for this visit.               Okey Zelek L

## 2015-10-14 NOTE — H&P (Signed)
  Procedure: Surveillance colonoscopy. 07/15/2011 right colectomy was performed to remove T2N0 adenocarcinoma of the right colon. 2014 normal surveillance colonoscopy was performed  History: The patient is a 75 year old male born 1940/11/17. He underwent a segmental right colon resection to remove adenocarcinoma on 07/15/2011. He underwent a normal surveillance colonoscopy one year postoperatively.  He is scheduled to undergo a repeat surveillance colonoscopy today.  Medication allergies: Penicillin. Intravenous contrast.  Past medical history: Type 2 diabetes mellitus. Hypertension. Right knee surgery. Coronary artery stent placement in 2001. 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 Hypercholesterolemia. Coronary artery disease.  Exam: The patient is alert and lying comfortably on the endoscopy stretcher. Abdomen is soft and nontender to palpation. Lungs are clear to auscultation. Cardiac exam reveals a regular rhythm.  Plan: Proceed with surveillance colonoscopy

## 2015-10-14 NOTE — Op Note (Signed)
[facility] Patient Name: Luis Hammond Procedure Date: 10/14/2015 MRN: [id number] Attending MD: Garlan Fair , MD Date of Birth: 1941-01-02 CSN: [id number] Age: 75 Admit Type: Inpatient Procedure:                Colonoscopy Indications:              High risk colon cancer surveillance: Personal                            history of colon cancer: T2N0 adenocarcinoma of the                            right colon removed on 07/15/2011. Providers:                Garlan Fair, MD, Hilma Favors, RN, Zenon Mayo, RN, Despina Pole, Technician, Delena Bali,                            CRNA Referring MD:              Medicines:                Propofol per Anesthesia Complications:            No immediate complications. Estimated Blood Loss:     Estimated blood loss: none. Procedure:                Pre-Anesthesia Assessment:                           - Prior to the procedure, a History and Physical                            was performed, and patient medications and                            allergies were reviewed. The patient's tolerance of                            previous anesthesia was also reviewed. The risks                            and benefits of the procedure and the sedation                            options and risks were discussed with the patient.                            All questions were answered, and informed consent                            was obtained. Prior Anticoagulants: The patient has  taken aspirin, last dose was 1 day prior to                            procedure. ASA Grade Assessment: III - A patient                            with severe systemic disease. After reviewing the                            risks and benefits, the patient was deemed in                            satisfactory condition to undergo the procedure.                           After obtaining  informed consent, the colonoscope                            was passed under direct vision. Throughout the                            procedure, the patient's blood pressure, pulse, and                            oxygen saturations were monitored continuously. The                            Colonoscope was introduced through the anus and                            advanced to the the ileocolonic anastomosis. The                            colonoscopy was performed without difficulty. The                            patient tolerated the procedure well. The quality                            of the bowel preparation was good. The terminal                            ileum and the rectum were photographed. Scope In: 9:27:41 AM Scope Out: 9:37:23 AM Scope Withdrawal Time: 0 hours 6 minutes 13 seconds  Total Procedure Duration: 0 hours 9 minutes 42 seconds  Findings:      The perianal and digital rectal examinations were normal.      The entire examined colon appeared normal. Impression:               - The entire examined colon is normal.                           - No specimens collected. Moderate Sedation:      N/A- Per Anesthesia Care Recommendation:           -  Patient has a contact number available for                            emergencies. The signs and symptoms of potential                            delayed complications were discussed with the                            patient. Return to normal activities tomorrow.                            Written discharge instructions were provided to the                            patient.                           - Repeat colonoscopy in 5 years for surveillance.                           - Resume previous diet.                           - Continue present medications. Procedure Code(s):        --- Professional ---                           [phone], Colonoscopy, flexible; diagnostic, including                            collection of  specimen(s) by brushing or washing,                            when performed (separate procedure) Diagnosis Code(s):        --- Professional ---                           [id number], Personal history of other malignant                            neoplasm of large intestine CPT copyright 2016 American [employer] Association. All rights reserved. The codes documented in this report are preliminary and upon coder review may  be revised to meet current compliance requirements. Earle Gell, MD Garlan Fair, MD 10/14/2015 9:44:28 AM This report has been signed electronically. Number of Addenda: 0

## 2015-10-14 NOTE — Anesthesia Preprocedure Evaluation (Addendum)
Anesthesia Evaluation  Patient identified by MRN, date of birth, ID band Patient awake    Reviewed: Allergy & Precautions, H&P , NPO status , Patient's Chart, lab work & pertinent test results, reviewed documented beta blocker date and time   Airway Mallampati: II  TM Distance: >3 FB Neck ROM: full    Dental  (+) Dental Advisory Given, Edentulous Upper, Edentulous Lower   Pulmonary neg pulmonary ROS,    Pulmonary exam normal breath sounds clear to auscultation       Cardiovascular Exercise Tolerance: Good hypertension, Pt. on medications and Pt. on home beta blockers + CAD  Normal cardiovascular exam Rhythm:regular Rate:Normal     Neuro/Psych negative neurological ROS  negative psych ROS   GI/Hepatic negative GI ROS, Neg liver ROS, Colon cancer   Endo/Other  diabetes, Well Controlled, Type 2, Oral Hypoglycemic Agents  Renal/GU negative Renal ROS  negative genitourinary   Musculoskeletal   Abdominal   Peds  Hematology negative hematology ROS (+)   Anesthesia Other Findings   Reproductive/Obstetrics negative OB ROS                            Anesthesia Physical Anesthesia Plan  ASA: III  Anesthesia Plan: MAC   Post-op Pain Management:    Induction:   Airway Management Planned:   Additional Equipment:   Intra-op Plan:   Post-operative Plan:   Informed Consent: I have reviewed the patients History and Physical, chart, labs and discussed the procedure including the risks, benefits and alternatives for the proposed anesthesia with the patient or authorized representative who has indicated his/her understanding and acceptance.   Dental Advisory Given  Plan Discussed with: CRNA  Anesthesia Plan Comments:         Anesthesia Quick Evaluation

## 2015-10-14 NOTE — Discharge Instructions (Signed)

## 2015-10-16 ENCOUNTER — Encounter (HOSPITAL_COMMUNITY): Payer: Self-pay | Admitting: Gastroenterology

## 2015-10-28 DIAGNOSIS — J3089 Other allergic rhinitis: Secondary | ICD-10-CM | POA: Diagnosis not present

## 2015-11-06 DIAGNOSIS — J3089 Other allergic rhinitis: Secondary | ICD-10-CM | POA: Diagnosis not present

## 2015-11-25 DIAGNOSIS — H6502 Acute serous otitis media, left ear: Secondary | ICD-10-CM | POA: Diagnosis not present

## 2015-11-25 DIAGNOSIS — H9012 Conductive hearing loss, unilateral, left ear, with unrestricted hearing on the contralateral side: Secondary | ICD-10-CM | POA: Diagnosis not present

## 2015-11-25 DIAGNOSIS — H90A32 Mixed conductive and sensorineural hearing loss, unilateral, left ear with restricted hearing on the contralateral side: Secondary | ICD-10-CM | POA: Diagnosis not present

## 2015-11-25 DIAGNOSIS — H903 Sensorineural hearing loss, bilateral: Secondary | ICD-10-CM | POA: Diagnosis not present

## 2015-11-27 ENCOUNTER — Ambulatory Visit: Payer: Medicare Other | Admitting: Endocrinology

## 2015-12-02 DIAGNOSIS — H6502 Acute serous otitis media, left ear: Secondary | ICD-10-CM | POA: Diagnosis not present

## 2015-12-02 DIAGNOSIS — H90A32 Mixed conductive and sensorineural hearing loss, unilateral, left ear with restricted hearing on the contralateral side: Secondary | ICD-10-CM | POA: Diagnosis not present

## 2015-12-30 DIAGNOSIS — I7090 Unspecified atherosclerosis: Secondary | ICD-10-CM | POA: Diagnosis not present

## 2015-12-30 DIAGNOSIS — C182 Malignant neoplasm of ascending colon: Secondary | ICD-10-CM | POA: Diagnosis not present

## 2015-12-30 DIAGNOSIS — B351 Tinea unguium: Secondary | ICD-10-CM | POA: Diagnosis not present

## 2015-12-30 DIAGNOSIS — I798 Other disorders of arteries, arterioles and capillaries in diseases classified elsewhere: Secondary | ICD-10-CM | POA: Diagnosis not present

## 2015-12-30 DIAGNOSIS — L84 Corns and callosities: Secondary | ICD-10-CM | POA: Diagnosis not present

## 2016-01-01 DIAGNOSIS — E7800 Pure hypercholesterolemia, unspecified: Secondary | ICD-10-CM | POA: Diagnosis not present

## 2016-01-01 DIAGNOSIS — Z79899 Other long term (current) drug therapy: Secondary | ICD-10-CM | POA: Diagnosis not present

## 2016-01-01 DIAGNOSIS — Z0001 Encounter for general adult medical examination with abnormal findings: Secondary | ICD-10-CM | POA: Diagnosis not present

## 2016-01-01 DIAGNOSIS — E1165 Type 2 diabetes mellitus with hyperglycemia: Secondary | ICD-10-CM | POA: Diagnosis not present

## 2016-01-01 DIAGNOSIS — I2510 Atherosclerotic heart disease of native coronary artery without angina pectoris: Secondary | ICD-10-CM | POA: Diagnosis not present

## 2016-01-01 DIAGNOSIS — Z6834 Body mass index (BMI) 34.0-34.9, adult: Secondary | ICD-10-CM | POA: Diagnosis not present

## 2016-01-01 DIAGNOSIS — H60312 Diffuse otitis externa, left ear: Secondary | ICD-10-CM | POA: Diagnosis not present

## 2016-01-01 DIAGNOSIS — E119 Type 2 diabetes mellitus without complications: Secondary | ICD-10-CM | POA: Diagnosis not present

## 2016-01-01 DIAGNOSIS — I119 Hypertensive heart disease without heart failure: Secondary | ICD-10-CM | POA: Diagnosis not present

## 2016-01-03 DIAGNOSIS — H65193 Other acute nonsuppurative otitis media, bilateral: Secondary | ICD-10-CM | POA: Diagnosis not present

## 2016-01-03 DIAGNOSIS — H906 Mixed conductive and sensorineural hearing loss, bilateral: Secondary | ICD-10-CM | POA: Diagnosis not present

## 2016-01-24 DIAGNOSIS — H9012 Conductive hearing loss, unilateral, left ear, with unrestricted hearing on the contralateral side: Secondary | ICD-10-CM | POA: Diagnosis not present

## 2016-01-24 DIAGNOSIS — H90A32 Mixed conductive and sensorineural hearing loss, unilateral, left ear with restricted hearing on the contralateral side: Secondary | ICD-10-CM | POA: Diagnosis not present

## 2016-03-09 DIAGNOSIS — H9012 Conductive hearing loss, unilateral, left ear, with unrestricted hearing on the contralateral side: Secondary | ICD-10-CM | POA: Diagnosis not present

## 2016-03-19 DIAGNOSIS — M0579 Rheumatoid arthritis with rheumatoid factor of multiple sites without organ or systems involvement: Secondary | ICD-10-CM | POA: Diagnosis not present

## 2016-03-19 DIAGNOSIS — R29898 Other symptoms and signs involving the musculoskeletal system: Secondary | ICD-10-CM | POA: Diagnosis not present

## 2016-03-19 DIAGNOSIS — E559 Vitamin D deficiency, unspecified: Secondary | ICD-10-CM | POA: Diagnosis not present

## 2016-03-19 DIAGNOSIS — R5383 Other fatigue: Secondary | ICD-10-CM | POA: Diagnosis not present

## 2016-04-02 DIAGNOSIS — K759 Inflammatory liver disease, unspecified: Secondary | ICD-10-CM | POA: Diagnosis not present

## 2016-04-02 DIAGNOSIS — M0609 Rheumatoid arthritis without rheumatoid factor, multiple sites: Secondary | ICD-10-CM | POA: Diagnosis not present

## 2016-04-02 DIAGNOSIS — M0579 Rheumatoid arthritis with rheumatoid factor of multiple sites without organ or systems involvement: Secondary | ICD-10-CM | POA: Diagnosis not present

## 2016-04-02 DIAGNOSIS — D649 Anemia, unspecified: Secondary | ICD-10-CM | POA: Diagnosis not present

## 2016-04-07 DIAGNOSIS — R0789 Other chest pain: Secondary | ICD-10-CM | POA: Diagnosis not present

## 2016-04-07 DIAGNOSIS — Z6833 Body mass index (BMI) 33.0-33.9, adult: Secondary | ICD-10-CM | POA: Diagnosis not present

## 2016-04-07 DIAGNOSIS — R0602 Shortness of breath: Secondary | ICD-10-CM | POA: Diagnosis not present

## 2016-04-07 DIAGNOSIS — I10 Essential (primary) hypertension: Secondary | ICD-10-CM | POA: Diagnosis not present

## 2016-04-07 DIAGNOSIS — E784 Other hyperlipidemia: Secondary | ICD-10-CM | POA: Diagnosis not present

## 2016-04-07 DIAGNOSIS — Z23 Encounter for immunization: Secondary | ICD-10-CM | POA: Diagnosis not present

## 2016-04-07 DIAGNOSIS — Z79899 Other long term (current) drug therapy: Secondary | ICD-10-CM | POA: Diagnosis not present

## 2016-04-07 DIAGNOSIS — I119 Hypertensive heart disease without heart failure: Secondary | ICD-10-CM | POA: Diagnosis not present

## 2016-04-07 DIAGNOSIS — E7800 Pure hypercholesterolemia, unspecified: Secondary | ICD-10-CM | POA: Diagnosis not present

## 2016-04-07 DIAGNOSIS — I2510 Atherosclerotic heart disease of native coronary artery without angina pectoris: Secondary | ICD-10-CM | POA: Diagnosis not present

## 2016-04-07 DIAGNOSIS — D500 Iron deficiency anemia secondary to blood loss (chronic): Secondary | ICD-10-CM | POA: Diagnosis not present

## 2016-04-07 DIAGNOSIS — R9431 Abnormal electrocardiogram [ECG] [EKG]: Secondary | ICD-10-CM | POA: Diagnosis not present

## 2016-04-07 DIAGNOSIS — H60312 Diffuse otitis externa, left ear: Secondary | ICD-10-CM | POA: Diagnosis not present

## 2016-04-07 DIAGNOSIS — E119 Type 2 diabetes mellitus without complications: Secondary | ICD-10-CM | POA: Diagnosis not present

## 2016-05-05 DIAGNOSIS — E1142 Type 2 diabetes mellitus with diabetic polyneuropathy: Secondary | ICD-10-CM | POA: Diagnosis not present

## 2016-05-05 DIAGNOSIS — M79672 Pain in left foot: Secondary | ICD-10-CM | POA: Diagnosis not present

## 2016-05-05 DIAGNOSIS — M79671 Pain in right foot: Secondary | ICD-10-CM | POA: Diagnosis not present

## 2016-05-06 ENCOUNTER — Encounter: Payer: Self-pay | Admitting: Endocrinology

## 2016-05-06 ENCOUNTER — Ambulatory Visit (INDEPENDENT_AMBULATORY_CARE_PROVIDER_SITE_OTHER): Payer: Medicare Other | Admitting: Endocrinology

## 2016-05-06 VITALS — BP 136/70 | HR 55 | Ht 74.0 in | Wt 256.0 lb

## 2016-05-06 DIAGNOSIS — E1159 Type 2 diabetes mellitus with other circulatory complications: Secondary | ICD-10-CM

## 2016-05-06 DIAGNOSIS — M25579 Pain in unspecified ankle and joints of unspecified foot: Secondary | ICD-10-CM

## 2016-05-06 LAB — POCT GLYCOSYLATED HEMOGLOBIN (HGB A1C): Hemoglobin A1C: 6.7

## 2016-05-06 MED ORDER — PIOGLITAZONE HCL 15 MG PO TABS: 15.0000 mg | ORAL_TABLET | Freq: Every day | ORAL | 3 refills | Status: DC

## 2016-05-06 NOTE — Progress Notes (Signed)
Subjective:    Patient ID: Luis Hammond, male    DOB: 03-Dec-1940, 75 y.o.   MRN: [id number]  HPI Pt returns for f/u of diabetes mellitus: DM type: 2 Dx'ed: 99991111 Complications: CAD.   Therapy: 3 oral meds DKA: never Severe hypoglycemia: never Pancreatitis: never.   Other: he took insulin for a brief time in approx 2007, so he knows how; pioglitizone dosage is limited by edema; he stopped metformin-XR, due to diarrhea. Interval history: no cbg record, but states cbg's vary from 69-100's.   Past Medical History:  Diagnosis Date  . Arthritis    Rheumatoid arthritis- being treated  . Cancer [facility])    colon cancer(surgery only)- 3 yrs ago ([place])  . Coronary artery disease   . Diabetes ([place])   . History of kidney stones    x 3-4 episodes-passed  . Hyperlipidemia   . Hypertension     Past Surgical History:  Procedure Laterality Date  . BACK SURGERY     '01- herniated disc  . BLEPHAROPLASTY Bilateral   . COLON SURGERY     for colon cancer 3 yrs ago -[place]  . COLONOSCOPY WITH PROPOFOL N/A 10/14/2015   Procedure: COLONOSCOPY WITH PROPOFOL;  Surgeon: Garlan Fair, MD;  Location: [facility];  Service: Endoscopy;  Laterality: N/A;  . CORONARY ANGIOPLASTY     '01- X3 stents placed- no problems since- Dr. Priscille Heidelberg  . EYE SURGERY    . JOINT REPLACEMENT Left    LTKA-([place])  . TONSILLECTOMY      Social History   Social History  . Marital status: Married    Spouse name: N/A  . Number of children: N/A  . Years of education: N/A   Occupational History  . Not on file.   Social History Main Topics  . Smoking status: Never Smoker  . Smokeless tobacco: Never Used  . Alcohol use No  . Drug use: No  . Sexual activity: Not on file   Other Topics Concern  . Not on file   Social History Narrative   Epworth Sleepiness Scale - 7 (as of 05/09/15)    Current Outpatient Prescriptions on File Prior to Visit  Medication Sig Dispense Refill  . amLODipine  (NORVASC) 10 MG tablet Take 1 tablet (10 mg total) by mouth daily. 90 tablet 2  . aspirin EC 81 MG tablet Take 81 mg by mouth daily.    . Cholecalciferol (VITAMIN D-3) 1000 UNITS CAPS Take 2,000 Units by mouth daily.     . empagliflozin (JARDIANCE) 25 MG TABS tablet Take 25 mg by mouth daily. 90 tablet 3  . ezetimibe (ZETIA) 10 MG tablet Take 1 tablet (10 mg total) by mouth daily. 90 tablet 2  . ferrous sulfate 325 (65 FE) MG tablet Take 325 mg by mouth daily with breakfast.    . folic acid (FOLVITE) 1 MG tablet Take 1 mg by mouth daily.    Marland Kitchen losartan (COZAAR) 100 MG tablet Take 1 tablet (100 mg total) by mouth daily. 90 tablet 2  . methotrexate (RHEUMATREX) 2.5 MG tablet Take 7.5 mg by mouth every Monday. Caution:Chemotherapy. Protect from light.    . metoprolol succinate (TOPROL-XL) 25 MG 24 hr tablet Take 1 tablet (25 mg total) by mouth daily. 90 tablet 2  . polyvinyl alcohol (LIQUIFILM TEARS) 1.4 % ophthalmic solution Place 2 drops into both eyes as needed for dry eyes.    Marland Kitchen PRESCRIPTION MEDICATION Apply 1 application topically daily as needed (Applies to feet.). Cream for  feet    . RiTUXimab (RITUXAN IV) Inject into the vein every 6 (six) months.    . sitaGLIPtin (JANUVIA) 100 MG tablet Take 1 tablet (100 mg total) by mouth daily. 90 tablet 3   No current facility-administered medications on file prior to visit.     Allergies  Allergen Reactions  . Ivp Dye [Iodinated Diagnostic Agents] Anaphylaxis and Swelling  . Metformin And Related Diarrhea  . Onion Hives and Swelling  . Penicillins Itching and Rash    Has patient had a PCN reaction causing immediate rash, facial/tongue/throat swelling, SOB or lightheadedness with hypotension: yes Has patient had a PCN reaction causing severe rash involving mucus membranes or skin necrosis: no Has patient had a PCN reaction that required hospitalization yes - was in hospital when reaction occurred Has patient had a PCN reaction occurring within the  last 10 years: no If all of the above answers are "NO", then may proceed with Cephalosporin use.     Family History  Problem Relation Age of Onset  . Diabetes Father   . Diabetes Sister   . Diabetes Brother   . Other Brother     SIGHT ISSUES  . Heart disease Mother   . Dementia Mother   . Diabetes Sister   . Kidney disease Sister   . Diabetes Sister    BP 136/70   Pulse (!) 55   Ht 6\' 2"  (1.88 m)   Wt 256 lb (116.1 kg)   SpO2 97%   BMI 32.87 kg/m   Review of Systems He has pain at the left foot.  He feels this is due to poor circulation, and he wants to see if he qualifies for DM shoes.      Objective:   Physical Exam VITAL SIGNS:  See vs page GENERAL: no distress Pulses: dorsalis pedis intact bilat.   MSK: no deformity of the feet CV: 1+ bilat leg edema.  Skin:  no ulcer on the feet.  normal color and temp on the feet. Neuro: sensation is intact to touch on the feet Ext: There is bilateral onychomycosis of the toenails.   A1c=6.7%     Assessment & Plan:  Edema, worse Type 2 DM, with CAD: well-controlled  Patient is advised the following: Patient Instructions  check your blood sugar once a day.  vary the time of day when you check, between before the 3 meals, and at bedtime.  also check if you have symptoms of your blood sugar being too high or too low.  please keep a record of the readings and bring it to your next appointment here (or you can bring the meter itself).  You can write it on any piece of paper.  please call us sooner if your blood sugar goes below 70, or if you have a lot of readings over 200.  Please continue the same [place].  Please reduce the pioglitizone to 15 mg daily.  Please come back for a follow-up appointment in 4 months.

## 2016-05-06 NOTE — Patient Instructions (Addendum)
check your blood sugar once a day.  vary the time of day when you check, between before the 3 meals, and at bedtime.  also check if you have symptoms of your blood sugar being too high or too low.  please keep a record of the readings and bring it to your next appointment here (or you can bring the meter itself).  You can write it on any piece of paper.  please call us sooner if your blood sugar goes below 70, or if you have a lot of readings over 200.  Please continue the same [place].  Please reduce the pioglitizone to 15 mg daily.  Please come back for a follow-up appointment in 4 months.

## 2016-05-21 DIAGNOSIS — Z955 Presence of coronary angioplasty implant and graft: Secondary | ICD-10-CM | POA: Diagnosis not present

## 2016-05-21 DIAGNOSIS — E6609 Other obesity due to excess calories: Secondary | ICD-10-CM | POA: Diagnosis not present

## 2016-05-21 DIAGNOSIS — I2510 Atherosclerotic heart disease of native coronary artery without angina pectoris: Secondary | ICD-10-CM | POA: Diagnosis not present

## 2016-05-21 DIAGNOSIS — Z7189 Other specified counseling: Secondary | ICD-10-CM | POA: Diagnosis not present

## 2016-05-21 DIAGNOSIS — I700 Atherosclerosis of aorta: Secondary | ICD-10-CM | POA: Diagnosis not present

## 2016-06-16 DIAGNOSIS — H6982 Other specified disorders of Eustachian tube, left ear: Secondary | ICD-10-CM | POA: Diagnosis not present

## 2016-06-16 DIAGNOSIS — H9212 Otorrhea, left ear: Secondary | ICD-10-CM | POA: Diagnosis not present

## 2016-07-06 DIAGNOSIS — N201 Calculus of ureter: Secondary | ICD-10-CM | POA: Diagnosis not present

## 2016-07-06 DIAGNOSIS — C182 Malignant neoplasm of ascending colon: Secondary | ICD-10-CM | POA: Diagnosis not present

## 2016-07-06 DIAGNOSIS — N400 Enlarged prostate without lower urinary tract symptoms: Secondary | ICD-10-CM | POA: Diagnosis not present

## 2016-07-07 DIAGNOSIS — E119 Type 2 diabetes mellitus without complications: Secondary | ICD-10-CM | POA: Diagnosis not present

## 2016-07-07 DIAGNOSIS — H2513 Age-related nuclear cataract, bilateral: Secondary | ICD-10-CM | POA: Diagnosis not present

## 2016-07-07 DIAGNOSIS — H02413 Mechanical ptosis of bilateral eyelids: Secondary | ICD-10-CM | POA: Diagnosis not present

## 2016-07-07 DIAGNOSIS — H36 Retinal disorders in diseases classified elsewhere: Secondary | ICD-10-CM | POA: Diagnosis not present

## 2016-07-28 DIAGNOSIS — L603 Nail dystrophy: Secondary | ICD-10-CM | POA: Diagnosis not present

## 2016-07-28 DIAGNOSIS — I739 Peripheral vascular disease, unspecified: Secondary | ICD-10-CM | POA: Diagnosis not present

## 2016-07-28 DIAGNOSIS — E1151 Type 2 diabetes mellitus with diabetic peripheral angiopathy without gangrene: Secondary | ICD-10-CM | POA: Diagnosis not present

## 2016-08-26 ENCOUNTER — Other Ambulatory Visit: Payer: Self-pay | Admitting: Endocrinology

## 2016-08-31 ENCOUNTER — Encounter: Payer: Self-pay | Admitting: Endocrinology

## 2016-08-31 ENCOUNTER — Ambulatory Visit (INDEPENDENT_AMBULATORY_CARE_PROVIDER_SITE_OTHER): Payer: Medicare Other | Admitting: Endocrinology

## 2016-08-31 VITALS — BP 126/74 | HR 56 | Ht 74.0 in | Wt 257.0 lb

## 2016-08-31 DIAGNOSIS — E1159 Type 2 diabetes mellitus with other circulatory complications: Secondary | ICD-10-CM | POA: Diagnosis not present

## 2016-08-31 LAB — POCT GLYCOSYLATED HEMOGLOBIN (HGB A1C): Hemoglobin A1C: 6.6

## 2016-08-31 LAB — MICROALBUMIN / CREATININE URINE RATIO
Creatinine,U: 75.7 mg/dL
Microalb Creat Ratio: 0.9 mg/g (ref 0.0–30.0)
Microalb, Ur: <0.7 mg/dL (ref 0.0–1.9)

## 2016-08-31 NOTE — Patient Instructions (Addendum)
check your blood sugar once a day.  vary the time of day when you check, between before the 3 meals, and at bedtime.  also check if you have symptoms of your blood sugar being too high or too low.  please keep a record of the readings and bring it to your next appointment here (or you can bring the meter itself).  You can write it on any piece of paper.  please call us sooner if your blood sugar goes below 70, or if you have a lot of readings over 200.  Please stop taking the pioglitizone, and Please continue the same other medications. Please come back for a follow-up appointment in 4 months.

## 2016-08-31 NOTE — Progress Notes (Signed)
Subjective:    Patient ID: Luis Hammond, male    DOB: December 06, 1940, 76 y.o.   MRN: [id number]  HPI Pt returns for f/u of diabetes mellitus: DM type: 2 Dx'ed: 7124 Complications: CAD.   Therapy: 3 oral meds.   DKA: never.   Severe hypoglycemia: never.  Pancreatitis: never.   Other: he took insulin for a brief time in approx 2007, so he knows how; pioglitizone dosage is limited by edema; he stopped metformin-XR, due to diarrhea. Interval history: no cbg record, but states cbg's are well-controlled.  pt states he feels well in general.  Past Medical History:  Diagnosis Date  . Arthritis    Rheumatoid arthritis- being treated  . Cancer [facility])    colon cancer(surgery only)- 3 yrs ago ([place])  . Coronary artery disease   . Diabetes ([place])   . History of kidney stones    x 3-4 episodes-passed  . Hyperlipidemia   . Hypertension     Past Surgical History:  Procedure Laterality Date  . BACK SURGERY     '01- herniated disc  . BLEPHAROPLASTY Bilateral   . COLON SURGERY     for colon cancer 3 yrs ago [place]  . COLONOSCOPY WITH PROPOFOL N/A 10/14/2015   Procedure: COLONOSCOPY WITH PROPOFOL;  Surgeon: Garlan Fair, MD;  Location: [facility];  Service: Endoscopy;  Laterality: N/A;  . CORONARY ANGIOPLASTY     '01- X3 stents placed- no problems since- Dr. Priscille Heidelberg  . EYE SURGERY    . JOINT REPLACEMENT Left    LTKA-([place])  . TONSILLECTOMY      Social History   Social History  . Marital status: Married    Spouse name: N/A  . Number of children: N/A  . Years of education: N/A   Occupational History  . Not on file.   Social History Main Topics  . Smoking status: Never Smoker  . Smokeless tobacco: Never Used  . Alcohol use No  . Drug use: No  . Sexual activity: Not on file   Other Topics Concern  . Not on file   Social History Narrative   Epworth Sleepiness Scale - 7 (as of 05/09/15)    Current Outpatient Prescriptions on File Prior to Visit    Medication Sig Dispense Refill  . amLODipine (NORVASC) 10 MG tablet Take 1 tablet (10 mg total) by mouth daily. 90 tablet 2  . aspirin EC 81 MG tablet Take 81 mg by mouth daily.    . Cholecalciferol (VITAMIN D-3) 1000 UNITS CAPS Take 2,000 Units by mouth daily.     . empagliflozin (JARDIANCE) 25 MG TABS tablet Take 25 mg by mouth daily. 90 tablet 3  . ezetimibe (ZETIA) 10 MG tablet Take 1 tablet (10 mg total) by mouth daily. 90 tablet 2  . ferrous sulfate 325 (65 FE) MG tablet Take 325 mg by mouth daily with breakfast.    . folic acid (FOLVITE) 1 MG tablet Take 1 mg by mouth daily.    Marland Kitchen JANUVIA 100 MG tablet TAKE 1 TABLET BY MOUTH ONCE DAILY 90 tablet 3  . losartan (COZAAR) 100 MG tablet Take 1 tablet (100 mg total) by mouth daily. 90 tablet 2  . methotrexate (RHEUMATREX) 2.5 MG tablet Take 7.5 mg by mouth every Monday. Caution:Chemotherapy. Protect from light.    . metoprolol succinate (TOPROL-XL) 25 MG 24 hr tablet Take 1 tablet (25 mg total) by mouth daily. 90 tablet 2  . polyvinyl alcohol (LIQUIFILM TEARS) 1.4 % ophthalmic solution Place  2 drops into both eyes as needed for dry eyes.    Marland Kitchen PRESCRIPTION MEDICATION Apply 1 application topically daily as needed (Applies to feet.). Cream for feet    . RiTUXimab (RITUXAN IV) Inject into the vein every 6 (six) months.     No current facility-administered medications on file prior to visit.     Allergies  Allergen Reactions  . Ivp Dye [Iodinated Diagnostic Agents] Anaphylaxis and Swelling  . Metformin And Related Diarrhea  . Onion Hives and Swelling  . Penicillins Itching and Rash    Has patient had a PCN reaction causing immediate rash, facial/tongue/throat swelling, SOB or lightheadedness with hypotension: yes Has patient had a PCN reaction causing severe rash involving mucus membranes or skin necrosis: no Has patient had a PCN reaction that required hospitalization yes - was in hospital when reaction occurred Has patient had a PCN  reaction occurring within the last 10 years: no If all of the above answers are "NO", then may proceed with Cephalosporin use.     Family History  Problem Relation Age of Onset  . Diabetes Father   . Diabetes Sister   . Diabetes Brother   . Other Brother     SIGHT ISSUES  . Heart disease Mother   . Dementia Mother   . Diabetes Sister   . Kidney disease Sister   . Diabetes Sister     BP 126/74   Pulse (!) 56   Ht 6\' 2"  (1.88 m)   Wt 257 lb (116.6 kg)   SpO2 95%   BMI 33.00 kg/m    Review of Systems He denies hypoglycemia and weight change.      Objective:   Physical Exam VITAL SIGNS:  See vs page GENERAL: no distress Pulses: dorsalis pedis intact bilat.   MSK: no deformity of the feet CV: 1+ bilat leg edema.  Skin:  no ulcer on the feet.  normal color and temp on the feet.  Neuro: sensation is intact to touch on the feet.  Ext: There is bilateral onychomycosis of the toenails.   a1c=6.6%    Assessment & Plan:  Edema, persistent, prob due to the norvasc, but the pioglitizone can contribute.   Type 2 DM: well-controlled.   HTN: well-controlled.    Patient Instructions  check your blood sugar once a day.  vary the time of day when you check, between before the 3 meals, and at bedtime.  also check if you have symptoms of your blood sugar being too high or too low.  please keep a record of the readings and bring it to your next appointment here (or you can bring the meter itself).  You can write it on any piece of paper.  please call us sooner if your blood sugar goes below 70, or if you have a lot of readings over 200.  Please stop taking the pioglitizone, and Please continue the same other medications. Please come back for a follow-up appointment in 4 months.

## 2016-09-07 DIAGNOSIS — H6092 Unspecified otitis externa, left ear: Secondary | ICD-10-CM | POA: Diagnosis not present

## 2016-09-14 DIAGNOSIS — E559 Vitamin D deficiency, unspecified: Secondary | ICD-10-CM | POA: Diagnosis not present

## 2016-09-14 DIAGNOSIS — M0579 Rheumatoid arthritis with rheumatoid factor of multiple sites without organ or systems involvement: Secondary | ICD-10-CM | POA: Diagnosis not present

## 2016-09-14 DIAGNOSIS — R5383 Other fatigue: Secondary | ICD-10-CM | POA: Diagnosis not present

## 2016-09-14 DIAGNOSIS — D649 Anemia, unspecified: Secondary | ICD-10-CM | POA: Diagnosis not present

## 2016-09-15 DIAGNOSIS — I700 Atherosclerosis of aorta: Secondary | ICD-10-CM | POA: Diagnosis not present

## 2016-09-15 DIAGNOSIS — I2510 Atherosclerotic heart disease of native coronary artery without angina pectoris: Secondary | ICD-10-CM | POA: Diagnosis not present

## 2016-09-28 DIAGNOSIS — R29898 Other symptoms and signs involving the musculoskeletal system: Secondary | ICD-10-CM | POA: Diagnosis not present

## 2016-09-28 DIAGNOSIS — R5382 Chronic fatigue, unspecified: Secondary | ICD-10-CM | POA: Diagnosis not present

## 2016-09-28 DIAGNOSIS — M0579 Rheumatoid arthritis with rheumatoid factor of multiple sites without organ or systems involvement: Secondary | ICD-10-CM | POA: Diagnosis not present

## 2016-11-02 DIAGNOSIS — E1151 Type 2 diabetes mellitus with diabetic peripheral angiopathy without gangrene: Secondary | ICD-10-CM | POA: Diagnosis not present

## 2016-11-02 DIAGNOSIS — I739 Peripheral vascular disease, unspecified: Secondary | ICD-10-CM | POA: Diagnosis not present

## 2016-11-02 DIAGNOSIS — L84 Corns and callosities: Secondary | ICD-10-CM | POA: Diagnosis not present

## 2016-11-02 DIAGNOSIS — L603 Nail dystrophy: Secondary | ICD-10-CM | POA: Diagnosis not present

## 2016-11-19 DIAGNOSIS — H9212 Otorrhea, left ear: Secondary | ICD-10-CM | POA: Diagnosis not present

## 2016-11-19 DIAGNOSIS — H90A32 Mixed conductive and sensorineural hearing loss, unilateral, left ear with restricted hearing on the contralateral side: Secondary | ICD-10-CM | POA: Diagnosis not present

## 2016-11-23 DIAGNOSIS — H9212 Otorrhea, left ear: Secondary | ICD-10-CM | POA: Diagnosis not present

## 2016-11-27 DIAGNOSIS — H9212 Otorrhea, left ear: Secondary | ICD-10-CM | POA: Diagnosis not present

## 2016-12-04 ENCOUNTER — Ambulatory Visit (INDEPENDENT_AMBULATORY_CARE_PROVIDER_SITE_OTHER): Payer: Medicare Other | Admitting: Endocrinology

## 2016-12-04 ENCOUNTER — Encounter: Payer: Self-pay | Admitting: Endocrinology

## 2016-12-04 VITALS — BP 132/82 | HR 73 | Ht 74.0 in | Wt 251.0 lb

## 2016-12-04 DIAGNOSIS — H9012 Conductive hearing loss, unilateral, left ear, with unrestricted hearing on the contralateral side: Secondary | ICD-10-CM | POA: Diagnosis not present

## 2016-12-04 DIAGNOSIS — E1159 Type 2 diabetes mellitus with other circulatory complications: Secondary | ICD-10-CM | POA: Diagnosis not present

## 2016-12-04 DIAGNOSIS — H9212 Otorrhea, left ear: Secondary | ICD-10-CM | POA: Diagnosis not present

## 2016-12-04 DIAGNOSIS — H6982 Other specified disorders of Eustachian tube, left ear: Secondary | ICD-10-CM | POA: Diagnosis not present

## 2016-12-04 DIAGNOSIS — H90A32 Mixed conductive and sensorineural hearing loss, unilateral, left ear with restricted hearing on the contralateral side: Secondary | ICD-10-CM | POA: Diagnosis not present

## 2016-12-04 LAB — POCT GLYCOSYLATED HEMOGLOBIN (HGB A1C): HEMOGLOBIN A1C: 8.5

## 2016-12-04 MED ORDER — REPAGLINIDE 0.5 MG PO TABS: 0.5000 mg | ORAL_TABLET | Freq: Three times a day (TID) | ORAL | 3 refills | Status: DC

## 2016-12-04 NOTE — Progress Notes (Signed)
Subjective:    Patient ID: Luis Hammond, male    DOB: July 29, 1940, 76 y.o.   MRN: [id number]  HPI Pt returns for f/u of diabetes mellitus: DM type: 2 Dx'ed: 7408 Complications: CAD.   Therapy: 2 oral meds.   DKA: never.   Severe hypoglycemia: never.  Pancreatitis: never.   Other: he took insulin for a brief time in approx 2007, so he knows how; he did not tolerate pioglitizone (edema), or metformin-XR (diarrhea). Interval history: no cbg record, but states cbg's are in the mid-100's.  pt states he feels well in general.  Past Medical History:  Diagnosis Date  . Arthritis    Rheumatoid arthritis- being treated  . Cancer [facility])    colon cancer(surgery only)- 3 yrs ago ([place])  . Coronary artery disease   . Diabetes ([place])   . History of kidney stones    x 3-4 episodes-passed  . Hyperlipidemia   . Hypertension     Past Surgical History:  Procedure Laterality Date  . BACK SURGERY     '01- herniated disc  . BLEPHAROPLASTY Bilateral   . COLON SURGERY     for colon cancer 3 yrs ago -[place]  . COLONOSCOPY WITH PROPOFOL N/A 10/14/2015   Procedure: COLONOSCOPY WITH PROPOFOL;  Surgeon: Garlan Fair, MD;  Location: [facility];  Service: Endoscopy;  Laterality: N/A;  . CORONARY ANGIOPLASTY     '01- X3 stents placed- no problems since- Dr. Priscille Heidelberg  . EYE SURGERY    . JOINT REPLACEMENT Left    LTKA-([place])  . TONSILLECTOMY      Social History   Social History  . Marital status: Married    Spouse name: N/A  . Number of children: N/A  . Years of education: N/A   Occupational History  . Not on file.   Social History Main Topics  . Smoking status: Never Smoker  . Smokeless tobacco: Never Used  . Alcohol use No  . Drug use: No  . Sexual activity: Not on file   Other Topics Concern  . Not on file   Social History Narrative   Epworth Sleepiness Scale - 7 (as of 05/09/15)    Current Outpatient Prescriptions on File Prior to Visit  Medication  Sig Dispense Refill  . amLODipine (NORVASC) 10 MG tablet Take 1 tablet (10 mg total) by mouth daily. 90 tablet 2  . aspirin EC 81 MG tablet Take 81 mg by mouth daily.    . Cholecalciferol (VITAMIN D-3) 1000 UNITS CAPS Take 2,000 Units by mouth daily.     . empagliflozin (JARDIANCE) 25 MG TABS tablet Take 25 mg by mouth daily. 90 tablet 3  . ezetimibe (ZETIA) 10 MG tablet Take 1 tablet (10 mg total) by mouth daily. 90 tablet 2  . ferrous sulfate 325 (65 FE) MG tablet Take 325 mg by mouth daily with breakfast.    . folic acid (FOLVITE) 1 MG tablet Take 1 mg by mouth daily.    Marland Kitchen JANUVIA 100 MG tablet TAKE 1 TABLET BY MOUTH ONCE DAILY 90 tablet 3  . losartan (COZAAR) 100 MG tablet Take 1 tablet (100 mg total) by mouth daily. 90 tablet 2  . methotrexate (RHEUMATREX) 2.5 MG tablet Take 7.5 mg by mouth every Monday. Caution:Chemotherapy. Protect from light.    . metoprolol succinate (TOPROL-XL) 25 MG 24 hr tablet Take 1 tablet (25 mg total) by mouth daily. 90 tablet 2  . polyvinyl alcohol (LIQUIFILM TEARS) 1.4 % ophthalmic solution Place 2 drops  into both eyes as needed for dry eyes.    Marland Kitchen PRESCRIPTION MEDICATION Apply 1 application topically daily as needed (Applies to feet.). Cream for feet    . RiTUXimab (RITUXAN IV) Inject into the vein every 6 (six) months.     No current facility-administered medications on file prior to visit.     Allergies  Allergen Reactions  . Ivp Dye [Iodinated Diagnostic Agents] Anaphylaxis and Swelling  . Metformin And Related Diarrhea  . Onion Hives and Swelling  . Penicillins Itching and Rash    Has patient had a PCN reaction causing immediate rash, facial/tongue/throat swelling, SOB or lightheadedness with hypotension: yes Has patient had a PCN reaction causing severe rash involving mucus membranes or skin necrosis: no Has patient had a PCN reaction that required hospitalization yes - was in hospital when reaction occurred Has patient had a PCN reaction occurring  within the last 10 years: no If all of the above answers are "NO", then may proceed with Cephalosporin use.     Family History  Problem Relation Age of Onset  . Diabetes Father   . Diabetes Sister   . Diabetes Brother   . Other Brother        SIGHT ISSUES  . Heart disease Mother   . Dementia Mother   . Diabetes Sister   . Kidney disease Sister   . Diabetes Sister     BP 132/82   Pulse 73   Ht 6\' 2"  (1.88 m)   Wt 251 lb (113.9 kg)   SpO2 96%   BMI 32.23 kg/m    Review of Systems He has lost a few lbs    Objective:   Physical Exam VITAL SIGNS:  See vs page GENERAL: no distress Pulses: dorsalis pedis intact bilat.   MSK: no deformity of the feet CV: no leg edema.  Skin:  no ulcer on the feet.  normal color and temp on the feet.  Neuro: sensation is intact to touch on the feet.  Ext: There is bilateral onychomycosis of the toenails.   A1c=8.5%    Assessment & Plan:  Type 2 DM, with CAD, worse Edema: resolved off pioglitizone.  Patient Instructions  check your blood sugar once a day.  vary the time of day when you check, between before the 3 meals, and at bedtime.  also check if you have symptoms of your blood sugar being too high or too low.  please keep a record of the readings and bring it to your next appointment here (or you can bring the meter itself).  You can write it on any piece of paper.  please call us sooner if your blood sugar goes below 70, or if you have a lot of readings over 200.  I have sent a prescription to your pharmacy, to add "repaglinide,", and Please continue the same other medications. Please come back for a follow-up appointment in 3 months.

## 2016-12-04 NOTE — Patient Instructions (Addendum)
check your blood sugar once a day.  vary the time of day when you check, between before the 3 meals, and at bedtime.  also check if you have symptoms of your blood sugar being too high or too low.  please keep a record of the readings and bring it to your next appointment here (or you can bring the meter itself).  You can write it on any piece of paper.  please call us sooner if your blood sugar goes below 70, or if you have a lot of readings over 200.  I have sent a prescription to your pharmacy, to add "repaglinide,", and Please continue the same other medications. Please come back for a follow-up appointment in 3 months.

## 2016-12-16 ENCOUNTER — Emergency Department (HOSPITAL_COMMUNITY)
Admission: EM | Admit: 2016-12-16 | Discharge: 2016-12-16 | Disposition: A | Discharge status: Home / Self Care | Payer: Medicare Other | Attending: Emergency Medicine | Admitting: Emergency Medicine

## 2016-12-16 ENCOUNTER — Emergency Department (HOSPITAL_COMMUNITY): Payer: Medicare Other

## 2016-12-16 ENCOUNTER — Encounter (HOSPITAL_COMMUNITY): Payer: Self-pay | Admitting: *Deleted

## 2016-12-16 DIAGNOSIS — M25512 Pain in left shoulder: Secondary | ICD-10-CM | POA: Diagnosis not present

## 2016-12-16 DIAGNOSIS — Z79899 Other long term (current) drug therapy: Secondary | ICD-10-CM | POA: Diagnosis not present

## 2016-12-16 DIAGNOSIS — Z880 Allergy status to penicillin: Secondary | ICD-10-CM | POA: Diagnosis not present

## 2016-12-16 DIAGNOSIS — S4992XA Unspecified injury of left shoulder and upper arm, initial encounter: Secondary | ICD-10-CM | POA: Diagnosis not present

## 2016-12-16 DIAGNOSIS — Z7984 Long term (current) use of oral hypoglycemic drugs: Secondary | ICD-10-CM | POA: Diagnosis not present

## 2016-12-16 DIAGNOSIS — Z7982 Long term (current) use of aspirin: Secondary | ICD-10-CM | POA: Diagnosis not present

## 2016-12-16 DIAGNOSIS — R0789 Other chest pain: Secondary | ICD-10-CM | POA: Diagnosis not present

## 2016-12-16 DIAGNOSIS — I2510 Atherosclerotic heart disease of native coronary artery without angina pectoris: Secondary | ICD-10-CM | POA: Insufficient documentation

## 2016-12-16 DIAGNOSIS — I119 Hypertensive heart disease without heart failure: Secondary | ICD-10-CM | POA: Insufficient documentation

## 2016-12-16 DIAGNOSIS — E119 Type 2 diabetes mellitus without complications: Secondary | ICD-10-CM | POA: Insufficient documentation

## 2016-12-16 DIAGNOSIS — R079 Chest pain, unspecified: Secondary | ICD-10-CM | POA: Diagnosis not present

## 2016-12-16 DIAGNOSIS — S299XXA Unspecified injury of thorax, initial encounter: Secondary | ICD-10-CM | POA: Diagnosis not present

## 2016-12-16 DIAGNOSIS — Z85038 Personal history of other malignant neoplasm of large intestine: Secondary | ICD-10-CM | POA: Insufficient documentation

## 2016-12-16 LAB — BASIC METABOLIC PANEL
Anion gap: 9 (ref 5–15)
BUN: 15 mg/dL (ref 6–20)
CALCIUM: 9 mg/dL (ref 8.9–10.3)
CHLORIDE: 107 mmol/L (ref 101–111)
CO2: 24 mmol/L (ref 22–32)
CREATININE: 1.24 mg/dL (ref 0.61–1.24)
GFR calc non Af Amer: 55 mL/min — ABNORMAL LOW (ref >60)
Glucose, Bld: 196 mg/dL — ABNORMAL HIGH (ref 65–99)
Potassium: 3.6 mmol/L (ref 3.5–5.1)
SODIUM: 140 mmol/L (ref 135–145)

## 2016-12-16 LAB — CBC
HCT: 40.7 % (ref 39.0–52.0)
Hemoglobin: 13.1 g/dL (ref 13.0–17.0)
MCH: 26.8 pg (ref 26.0–34.0)
MCHC: 32.2 g/dL (ref 30.0–36.0)
MCV: 83.2 fL (ref 78.0–100.0)
PLATELETS: 236 10*3/uL (ref 150–400)
RBC: 4.89 MIL/uL (ref 4.22–5.81)
RDW: 16.1 % — ABNORMAL HIGH (ref 11.5–15.5)
WBC: 9 10*3/uL (ref 4.0–10.5)

## 2016-12-16 LAB — I-STAT TROPONIN, ED: TROPONIN I, POC: 0 ng/mL (ref 0.00–0.08)

## 2016-12-16 NOTE — ED Notes (Signed)
Patient to xray before roomed. Xray will bring back to room.

## 2016-12-16 NOTE — ED Triage Notes (Signed)
PT states L chest and shoulder pain after slipping on bathroom floor and landing on L back/L shoulder.  He has been  Icing his L shoulder, but now his L anterior chest hurts.

## 2016-12-16 NOTE — ED Provider Notes (Signed)
[facility] DEPT Provider Note   CSN: [id number] Arrival date & time: 12/16/16  7893     History   Chief Complaint Chief Complaint  Patient presents with  . fall/shoulder and chest pain    HPI Luis Hammond is a 76 y.o. male.  HPI   76 yo M with PMHx as below here with chest and shoulder pain. Pt states that at 7 PM last night he was trying to get into the shower when his foot slipped and he fell into the side of the shower. He states his left shoulder and left upper chest hit the wall. No HA, LOC. No blood thinner use. He states he had no CP but had aching, throbbing L shoulder pain at that time. He iced it and it improved. Upon awakening this AM, however, he reports sharp, stabbing left sided upper chest pain that occurred briefly after coughing. He now has mild aching, intermittently sharp left lateral chest pain that is worse with movement and palpation. No CP at rest. No SOB, cough.  Past Medical History:  Diagnosis Date  . Arthritis    Rheumatoid arthritis- being treated  . Cancer [facility])    colon cancer(surgery only)- 3 yrs ago ([place])  . Coronary artery disease   . Diabetes ([place])   . History of kidney stones    x 3-4 episodes-passed  . Hyperlipidemia   . Hypertension     Patient Active Problem List   Diagnosis Date Noted  . Pain in joint, ankle and foot 05/06/2016  . Type 2 diabetes mellitus with circulatory disorder, without long-term current use of insulin ([place]) 08/05/2015  . Essential hypertension 05/09/2015  . Hyperlipidemia 05/09/2015  . CAD (coronary artery disease) 05/08/2015    Past Surgical History:  Procedure Laterality Date  . BACK SURGERY     '01- herniated disc  . BLEPHAROPLASTY Bilateral   . COLON SURGERY     for colon cancer 3 yrs ago [place]  . COLONOSCOPY WITH PROPOFOL N/A 10/14/2015   Procedure: COLONOSCOPY WITH PROPOFOL;  Surgeon: Garlan Fair, MD;  Location: [facility];  Service: Endoscopy;  Laterality: N/A;  . CORONARY  ANGIOPLASTY     '01- X3 stents placed- no problems since- Dr. Priscille Heidelberg  . EYE SURGERY    . JOINT REPLACEMENT Left    LTKA-([place])  . TONSILLECTOMY         Home Medications    Prior to Admission medications   Medication Sig Start Date End Date Taking? Authorizing Provider  acetaminophen (TYLENOL) 500 MG tablet Take 1,500 mg by mouth every 6 (six) hours as needed for mild pain.   Yes [provider]  amLODipine (NORVASC) 10 MG tablet Take 1 tablet (10 mg total) by mouth daily. 07/24/15  Yes Lorretta Harp, MD  aspirin EC 81 MG tablet Take 81 mg by mouth daily.   Yes [provider]  Cholecalciferol (VITAMIN D-3) 1000 UNITS CAPS Take 2,000 Units by mouth daily.    Yes [provider]  empagliflozin (JARDIANCE) 25 MG TABS tablet Take 25 mg by mouth daily. 06/08/15  Yes Renato Shin, MD  ezetimibe (ZETIA) 10 MG tablet Take 1 tablet (10 mg total) by mouth daily. 07/24/15  Yes Lorretta Harp, MD  ferrous sulfate 325 (65 FE) MG tablet Take 325 mg by mouth daily with breakfast.   Yes [provider]  folic acid (FOLVITE) 1 MG tablet Take 1 mg by mouth daily.   Yes [provider]  JANUVIA 100 MG  tablet TAKE 1 TABLET BY MOUTH ONCE DAILY 08/26/16  Yes Renato Shin, MD  losartan (COZAAR) 100 MG tablet Take 1 tablet (100 mg total) by mouth daily. 07/24/15  Yes Lorretta Harp, MD  methotrexate (RHEUMATREX) 2.5 MG tablet Take 7.5 mg by mouth every Monday. Caution:Chemotherapy. Protect from light.   Yes [provider]  metoprolol succinate (TOPROL-XL) 25 MG 24 hr tablet Take 1 tablet (25 mg total) by mouth daily. 07/24/15  Yes Lorretta Harp, MD  naproxen sodium (ANAPROX) 220 MG tablet Take 440 mg by mouth daily as needed (pain).    Yes [provider]  pioglitazone (ACTOS) 30 MG tablet Take 30 mg by mouth daily.   Yes [provider]  polyvinyl alcohol (LIQUIFILM TEARS) 1.4 % ophthalmic solution Place 2 drops into  both eyes as needed for dry eyes.   Yes [provider]  repaglinide (PRANDIN) 0.5 MG tablet Take 1 tablet (0.5 mg total) by mouth 3 (three) times daily before meals. Patient taking differently: Take 0.5 mg by mouth daily. qam before breakfast 12/04/16  Yes Renato Shin, MD  RiTUXimab (RITUXAN IV) Inject into the vein every 6 (six) months.   Yes [provider]    Family History Family History  Problem Relation Age of Onset  . Diabetes Father   . Diabetes Sister   . Diabetes Brother   . Other Brother        SIGHT ISSUES  . Heart disease Mother   . Dementia Mother   . Diabetes Sister   . Kidney disease Sister   . Diabetes Sister     Social History Social History  Substance Use Topics  . Smoking status: Never Smoker  . Smokeless tobacco: Never Used  . Alcohol use No     Allergies   Ivp dye [iodinated diagnostic agents]; Metformin and related; Onion; and Penicillins   Review of Systems Review of Systems  Cardiovascular: Positive for chest pain.  Musculoskeletal: Positive for arthralgias.  All other systems reviewed and are negative.    Physical Exam Updated Vital Signs BP (!) 184/85 (BP Location: Right Arm)   Pulse 62   Temp 98.5 F (36.9 C) (Oral)   Resp 16   SpO2 100%   Physical Exam  Constitutional: He is oriented to person, place, and time. He appears well-developed and well-nourished. No distress.  HENT:  Head: Normocephalic and atraumatic.  Eyes: Conjunctivae are normal.  Neck: Neck supple.  Cardiovascular: Normal rate, regular rhythm and normal heart sounds.  Exam reveals no friction rub.   No murmur heard. Pulmonary/Chest: Effort normal and breath sounds normal. No respiratory distress. He has no wheezes. He has no rales. He exhibits tenderness (TTP over left upper chest wall 2nd intercostal space, no deformity or bruising).  Abdominal: He exhibits no distension.  Musculoskeletal: He exhibits no edema.  Neurological: He is alert and  oriented to person, place, and time. He exhibits normal muscle tone.  Skin: Skin is warm. Capillary refill takes less than 2 seconds.  Psychiatric: He has a normal mood and affect.  Nursing note and vitals reviewed.   UPPER EXTREMITY EXAM: LEFT  INSPECTION & PALPATION: Moderate TTP over left upper chest/proximal GH joint. No deformity. Painless pROM.  SENSORY: Sensation is intact to light touch in:  Superficial radial nerve distribution (dorsal first web space) Median nerve distribution (tip of index finger)   Ulnar nerve distribution (tip of small finger)     MOTOR:  + Motor posterior interosseous nerve (  thumb IP extension) + Anterior interosseous nerve (thumb IP flexion, index finger DIP flexion) + Radial nerve (wrist extension) + Median nerve (palpable firing thenar mass) + Ulnar nerve (palpable firing of first dorsal interosseous muscle)  VASCULAR: 2+ radial pulse Brisk capillary refill < 2 sec, fingers warm and well-perfused   ED Treatments / Results  Labs (all labs ordered are listed, but only abnormal results are displayed) Labs Reviewed  BASIC METABOLIC PANEL - Abnormal; Notable for the following:       Result Value   Glucose, Bld 196 (*)    GFR calc non Af Amer 55 (*)    All other components within normal limits  CBC - Abnormal; Notable for the following:    RDW 16.1 (*)    All other components within normal limits  I-STAT TROPONIN, ED    EKG  EKG Interpretation  Date/Time:  Wednesday December 16 2016 08:15:47 EDT Ventricular Rate:  60 PR Interval:  142 QRS Duration: 100 QT Interval:  466 QTC Calculation: 466 R Axis:   -8 Text Interpretation:  Normal sinus rhythm Minimal voltage criteria for LVH, may be normal variant Nonspecific ST and T wave abnormality Prolonged QT Abnormal ECG No old tracing to compare Confirmed by Duffy Bruce [phone]) on 12/16/2016 8:19:44 AM       Radiology Dg Chest 2 View  Result Date: 12/16/2016 CLINICAL DATA:  Pt slipped  and fell in shower last night and landed on left shoulder - having superior left shoulder pain since - also today began feeling pain on left side of chest (sharp at times) - hx of CAD, diabetes, htn, heart stent EXAM: CHEST - 2 VIEW COMPARISON:  none FINDINGS: Lungs are clear. Heart size and mediastinal contours are within normal limits. Tortuous thoracic aorta. No effusion. Visualized bones unremarkable. IMPRESSION: No acute cardiopulmonary disease. Electronically Signed   By: Lucrezia Europe M.D.   On: 12/16/2016 08:39   Dg Shoulder Left  Result Date: 12/16/2016 CLINICAL DATA:  Pt slipped and fell in shower last night and landed on left shoulder - having superior left shoulder pain since EXAM: LEFT SHOULDER - 2+ VIEW COMPARISON:  None. FINDINGS: There is no evidence of displaced fracture or dislocation. There is no evidence of arthropathy or other focal bone abnormality. Soft tissues are unremarkable. IMPRESSION: Negative. Electronically Signed   By: Lucrezia Europe M.D.   On: 12/16/2016 08:39    Procedures Procedures (including critical care time)  Medications Ordered in ED Medications - No data to display   Initial Impression / Assessment and Plan / ED Course  I have reviewed the triage vital signs and the nursing notes.  Pertinent labs & imaging results that were available during my care of the patient were reviewed by me and considered in my medical decision making (see chart for details).     76 yo M here with left shoulder and upper chest wall pain s/p fall. Exam is as above. Suspect MSK pain with chest pain 2/2 rib contusion versus costochondritis. Pain is sharp, reproducible on exam, and positional. He has normal EKG, normal Trop despite constant sx and I do NOT suspect ACS, PE, or dissection. Labs are o/w reassuring. CXR is normal without fx. Shoulder film normal. Will treat with OTC analgesia, stretching, outpt follow-up.  Final Clinical Impressions(s) / ED Diagnoses   Final diagnoses:    Acute pain of left shoulder  Chest wall pain    New Prescriptions Discharge Medication List as of 12/16/2016 10:05 AM  Duffy Bruce, MD 12/16/16 1058

## 2016-12-16 NOTE — Discharge Instructions (Signed)
-   You can take ibuprofen 400 mg every 8 hours as needed - Take tylenol 1000 mg every 6 hours as well if needed

## 2017-01-05 DIAGNOSIS — H6122 Impacted cerumen, left ear: Secondary | ICD-10-CM | POA: Diagnosis not present

## 2017-01-05 DIAGNOSIS — Z9622 Myringotomy tube(s) status: Secondary | ICD-10-CM | POA: Diagnosis not present

## 2017-01-05 DIAGNOSIS — H6982 Other specified disorders of Eustachian tube, left ear: Secondary | ICD-10-CM | POA: Diagnosis not present

## 2017-01-27 DIAGNOSIS — I739 Peripheral vascular disease, unspecified: Secondary | ICD-10-CM | POA: Diagnosis not present

## 2017-01-27 DIAGNOSIS — L84 Corns and callosities: Secondary | ICD-10-CM | POA: Diagnosis not present

## 2017-01-27 DIAGNOSIS — E1151 Type 2 diabetes mellitus with diabetic peripheral angiopathy without gangrene: Secondary | ICD-10-CM | POA: Diagnosis not present

## 2017-01-27 DIAGNOSIS — L603 Nail dystrophy: Secondary | ICD-10-CM | POA: Diagnosis not present

## 2017-02-08 DIAGNOSIS — L600 Ingrowing nail: Secondary | ICD-10-CM | POA: Diagnosis not present

## 2017-02-22 DIAGNOSIS — L600 Ingrowing nail: Secondary | ICD-10-CM | POA: Diagnosis not present

## 2017-03-04 ENCOUNTER — Ambulatory Visit (INDEPENDENT_AMBULATORY_CARE_PROVIDER_SITE_OTHER): Payer: Medicare Other | Admitting: Endocrinology

## 2017-03-04 VITALS — BP 176/82 | HR 68 | Temp 98.8°F | Resp 16 | Wt 256.5 lb

## 2017-03-04 DIAGNOSIS — Z23 Encounter for immunization: Secondary | ICD-10-CM | POA: Diagnosis not present

## 2017-03-04 DIAGNOSIS — E1159 Type 2 diabetes mellitus with other circulatory complications: Secondary | ICD-10-CM | POA: Diagnosis not present

## 2017-03-04 DIAGNOSIS — M79674 Pain in right toe(s): Secondary | ICD-10-CM | POA: Diagnosis not present

## 2017-03-04 DIAGNOSIS — B351 Tinea unguium: Secondary | ICD-10-CM | POA: Diagnosis not present

## 2017-03-04 DIAGNOSIS — M79675 Pain in left toe(s): Secondary | ICD-10-CM | POA: Diagnosis not present

## 2017-03-04 LAB — POCT GLYCOSYLATED HEMOGLOBIN (HGB A1C): HEMOGLOBIN A1C: 7.1

## 2017-03-04 NOTE — Progress Notes (Signed)
Subjective:    Patient ID: Luis Hammond, male    DOB: December 31, 1940, 76 y.o.   MRN: [id number]  HPI Pt returns for f/u of diabetes mellitus: DM type: 2 Dx'ed: 5427 Complications: CAD and renal insuff Therapy: 4 oral meds.   DKA: never.   Severe hypoglycemia: never.  Pancreatitis: never.   Other: he took insulin for a brief time in approx 2007; he did not tolerate metformin-XR (diarrhea). Interval history: he does not check cbg's.  He takes meds as rx'ed.  pt states he feels well in general.   Past Medical History:  Diagnosis Date  . Arthritis    Rheumatoid arthritis- being treated  . Cancer [facility])    colon cancer(surgery only)- 3 yrs ago ([place])  . Coronary artery disease   . Diabetes ([place])   . History of kidney stones    x 3-4 episodes-passed  . Hyperlipidemia   . Hypertension     Past Surgical History:  Procedure Laterality Date  . BACK SURGERY     '01- herniated disc  . BLEPHAROPLASTY Bilateral   . COLON SURGERY     for colon cancer 3 yrs ago [place]  . COLONOSCOPY WITH PROPOFOL N/A 10/14/2015   Procedure: COLONOSCOPY WITH PROPOFOL;  Surgeon: Garlan Fair, MD;  Location[facility];  Service: Endoscopy;  Laterality: N/A;  . CORONARY ANGIOPLASTY     '01- X3 stents placed- no problems since- Dr. Priscille Heidelberg  . EYE SURGERY    . JOINT REPLACEMENT Left    LTKA-([place])  . TONSILLECTOMY      Social History   Social History  . Marital status: Married    Spouse name: N/A  . Number of children: N/A  . Years of education: N/A   Occupational History  . Not on file.   Social History Main Topics  . Smoking status: Never Smoker  . Smokeless tobacco: Never Used  . Alcohol use No  . Drug use: No  . Sexual activity: Not on file   Other Topics Concern  . Not on file   Social History Narrative   Epworth Sleepiness Scale - 7 (as of 05/09/15)    Current Outpatient Prescriptions on File Prior to Visit  Medication Sig Dispense Refill  .  acetaminophen (TYLENOL) 500 MG tablet Take 1,500 mg by mouth every 6 (six) hours as needed for mild pain.    Marland Kitchen amLODipine (NORVASC) 10 MG tablet Take 1 tablet (10 mg total) by mouth daily. 90 tablet 2  . aspirin EC 81 MG tablet Take 81 mg by mouth daily.    . Cholecalciferol (VITAMIN D-3) 1000 UNITS CAPS Take 2,000 Units by mouth daily.     . empagliflozin (JARDIANCE) 25 MG TABS tablet Take 25 mg by mouth daily. 90 tablet 3  . ezetimibe (ZETIA) 10 MG tablet Take 1 tablet (10 mg total) by mouth daily. 90 tablet 2  . ferrous sulfate 325 (65 FE) MG tablet Take 325 mg by mouth daily with breakfast.    . folic acid (FOLVITE) 1 MG tablet Take 1 mg by mouth daily.    Marland Kitchen JANUVIA 100 MG tablet TAKE 1 TABLET BY MOUTH ONCE DAILY 90 tablet 3  . losartan (COZAAR) 100 MG tablet Take 1 tablet (100 mg total) by mouth daily. 90 tablet 2  . methotrexate (RHEUMATREX) 2.5 MG tablet Take 7.5 mg by mouth every Monday. Caution:Chemotherapy. Protect from light.    . metoprolol succinate (TOPROL-XL) 25 MG 24 hr tablet Take 1 tablet (25 mg total)  by mouth daily. 90 tablet 2  . naproxen sodium (ANAPROX) 220 MG tablet Take 440 mg by mouth daily as needed (pain).     . pioglitazone (ACTOS) 30 MG tablet Take 30 mg by mouth daily.    . polyvinyl alcohol (LIQUIFILM TEARS) 1.4 % ophthalmic solution Place 2 drops into both eyes as needed for dry eyes.    . repaglinide (PRANDIN) 0.5 MG tablet Take 1 tablet (0.5 mg total) by mouth 3 (three) times daily before meals. (Patient taking differently: Take 0.5 mg by mouth daily. qam before breakfast) 270 tablet 3  . RiTUXimab (RITUXAN IV) Inject into the vein every 6 (six) months.     No current facility-administered medications on file prior to visit.     Allergies  Allergen Reactions  . Ioxaglate Anaphylaxis and Swelling  . Ivp Dye [Iodinated Diagnostic Agents] Anaphylaxis and Swelling  . Metformin And Related Diarrhea  . Onion Hives and Swelling  . Penicillins Itching and Rash     Has patient had a PCN reaction causing immediate rash, facial/tongue/throat swelling, SOB or lightheadedness with hypotension: yes Has patient had a PCN reaction causing severe rash involving mucus membranes or skin necrosis: no Has patient had a PCN reaction that required hospitalization yes - was in hospital when reaction occurred Has patient had a PCN reaction occurring within the last 10 years: no If all of the above answers are "NO", then may proceed with Cephalosporin use.     Family History  Problem Relation Age of Onset  . Diabetes Father   . Diabetes Sister   . Diabetes Brother   . Other Brother        SIGHT ISSUES  . Heart disease Mother   . Dementia Mother   . Diabetes Sister   . Kidney disease Sister   . Diabetes Sister     BP (!) 176/82 (BP Location: Left Arm, Patient Position: Sitting, Cuff Size: Normal)   Pulse 68   Temp 98.8 F (37.1 C) (Oral)   Resp 16   Wt 256 lb 8 oz (116.3 kg)   SpO2 99%   BMI 32.93 kg/m    Review of Systems He denies hypoglycemia    Objective:   Physical Exam VITAL SIGNS:  See vs page GENERAL: no distress Pulses: foot pulses are intact bilaterally.   MSK: no deformity of the feet or ankles.  CV: no edema of the legs or ankles Skin:  no ulcer on the feet or ankles.  normal color and temp on the feet and ankles.  Neuro: sensation is intact to touch on the feet and ankles.   Ext: There is bilateral onychomycosis of the toenails.   Lab Results  Component Value Date   HGBA1C 7.1 03/04/2017      Assessment & Plan:  Type 2 DM, with CAD: well-controlled for age.  Renal insuff: this limits rx options  Patient Instructions  check your blood sugar once a day.  vary the time of day when you check, between before the 3 meals, and at bedtime.  also check if you have symptoms of your blood sugar being too high or too low.  please keep a record of the readings and bring it to your next appointment here (or you can bring the meter  itself).  You can write it on any piece of paper.  please call us sooner if your blood sugar goes below 70, or if you have a lot of readings over 200.  Please continue the  same medications for diabetes Please come back for a follow-up appointment in 3-6 months.

## 2017-03-04 NOTE — Patient Instructions (Addendum)
check your blood sugar once a day.  vary the time of day when you check, between before the 3 meals, and at bedtime.  also check if you have symptoms of your blood sugar being too high or too low.  please keep a record of the readings and bring it to your next appointment here (or you can bring the meter itself).  You can write it on any piece of paper.  please call us sooner if your blood sugar goes below 70, or if you have a lot of readings over 200.  Please continue the same medications for diabetes Please come back for a follow-up appointment in 3-6 months.

## 2017-03-09 DIAGNOSIS — H6982 Other specified disorders of Eustachian tube, left ear: Secondary | ICD-10-CM | POA: Diagnosis not present

## 2017-03-09 DIAGNOSIS — H6092 Unspecified otitis externa, left ear: Secondary | ICD-10-CM | POA: Diagnosis not present

## 2017-03-09 DIAGNOSIS — Z9622 Myringotomy tube(s) status: Secondary | ICD-10-CM | POA: Diagnosis not present

## 2017-03-22 DIAGNOSIS — I2510 Atherosclerotic heart disease of native coronary artery without angina pectoris: Secondary | ICD-10-CM | POA: Diagnosis not present

## 2017-03-22 DIAGNOSIS — R079 Chest pain, unspecified: Secondary | ICD-10-CM | POA: Diagnosis not present

## 2017-03-22 DIAGNOSIS — E559 Vitamin D deficiency, unspecified: Secondary | ICD-10-CM | POA: Diagnosis not present

## 2017-03-22 DIAGNOSIS — R5383 Other fatigue: Secondary | ICD-10-CM | POA: Diagnosis not present

## 2017-03-22 DIAGNOSIS — R29898 Other symptoms and signs involving the musculoskeletal system: Secondary | ICD-10-CM | POA: Diagnosis not present

## 2017-03-22 DIAGNOSIS — K759 Inflammatory liver disease, unspecified: Secondary | ICD-10-CM | POA: Diagnosis not present

## 2017-03-22 DIAGNOSIS — M0579 Rheumatoid arthritis with rheumatoid factor of multiple sites without organ or systems involvement: Secondary | ICD-10-CM | POA: Diagnosis not present

## 2017-03-22 DIAGNOSIS — I700 Atherosclerosis of aorta: Secondary | ICD-10-CM | POA: Diagnosis not present

## 2017-03-22 DIAGNOSIS — D649 Anemia, unspecified: Secondary | ICD-10-CM | POA: Diagnosis not present

## 2017-03-22 DIAGNOSIS — I119 Hypertensive heart disease without heart failure: Secondary | ICD-10-CM | POA: Diagnosis not present

## 2017-03-23 DIAGNOSIS — E1122 Type 2 diabetes mellitus with diabetic chronic kidney disease: Secondary | ICD-10-CM | POA: Diagnosis not present

## 2017-03-23 DIAGNOSIS — Z125 Encounter for screening for malignant neoplasm of prostate: Secondary | ICD-10-CM | POA: Diagnosis not present

## 2017-03-23 DIAGNOSIS — E7800 Pure hypercholesterolemia, unspecified: Secondary | ICD-10-CM | POA: Diagnosis not present

## 2017-03-29 DIAGNOSIS — M79674 Pain in right toe(s): Secondary | ICD-10-CM | POA: Diagnosis not present

## 2017-03-29 DIAGNOSIS — B351 Tinea unguium: Secondary | ICD-10-CM | POA: Diagnosis not present

## 2017-03-29 DIAGNOSIS — M79675 Pain in left toe(s): Secondary | ICD-10-CM | POA: Diagnosis not present

## 2017-04-05 DIAGNOSIS — M0579 Rheumatoid arthritis with rheumatoid factor of multiple sites without organ or systems involvement: Secondary | ICD-10-CM | POA: Diagnosis not present

## 2017-04-06 DIAGNOSIS — I2510 Atherosclerotic heart disease of native coronary artery without angina pectoris: Secondary | ICD-10-CM | POA: Diagnosis not present

## 2017-04-06 DIAGNOSIS — R0789 Other chest pain: Secondary | ICD-10-CM | POA: Diagnosis not present

## 2017-04-06 DIAGNOSIS — E782 Mixed hyperlipidemia: Secondary | ICD-10-CM | POA: Diagnosis not present

## 2017-04-06 DIAGNOSIS — R0602 Shortness of breath: Secondary | ICD-10-CM | POA: Diagnosis not present

## 2017-04-06 DIAGNOSIS — I10 Essential (primary) hypertension: Secondary | ICD-10-CM | POA: Diagnosis not present

## 2017-04-06 DIAGNOSIS — R9431 Abnormal electrocardiogram [ECG] [EKG]: Secondary | ICD-10-CM | POA: Diagnosis not present

## 2017-04-21 DIAGNOSIS — B351 Tinea unguium: Secondary | ICD-10-CM | POA: Diagnosis not present

## 2017-04-21 DIAGNOSIS — M79671 Pain in right foot: Secondary | ICD-10-CM | POA: Diagnosis not present

## 2017-04-21 DIAGNOSIS — M79672 Pain in left foot: Secondary | ICD-10-CM | POA: Diagnosis not present

## 2017-04-30 DIAGNOSIS — M79674 Pain in right toe(s): Secondary | ICD-10-CM | POA: Diagnosis not present

## 2017-04-30 DIAGNOSIS — B351 Tinea unguium: Secondary | ICD-10-CM | POA: Diagnosis not present

## 2017-04-30 DIAGNOSIS — M79675 Pain in left toe(s): Secondary | ICD-10-CM | POA: Diagnosis not present

## 2017-05-05 ENCOUNTER — Other Ambulatory Visit: Payer: Self-pay | Admitting: Endocrinology

## 2017-05-06 DIAGNOSIS — L600 Ingrowing nail: Secondary | ICD-10-CM | POA: Diagnosis not present

## 2017-05-28 DIAGNOSIS — M79675 Pain in left toe(s): Secondary | ICD-10-CM | POA: Diagnosis not present

## 2017-05-28 DIAGNOSIS — M79674 Pain in right toe(s): Secondary | ICD-10-CM | POA: Diagnosis not present

## 2017-05-28 DIAGNOSIS — B351 Tinea unguium: Secondary | ICD-10-CM | POA: Diagnosis not present

## 2017-06-03 ENCOUNTER — Encounter: Payer: Self-pay | Admitting: Endocrinology

## 2017-06-03 ENCOUNTER — Ambulatory Visit (INDEPENDENT_AMBULATORY_CARE_PROVIDER_SITE_OTHER): Payer: Medicare Other | Admitting: Endocrinology

## 2017-06-03 VITALS — BP 182/92 | HR 52 | Wt 254.0 lb

## 2017-06-03 DIAGNOSIS — E1159 Type 2 diabetes mellitus with other circulatory complications: Secondary | ICD-10-CM

## 2017-06-03 LAB — POCT GLYCOSYLATED HEMOGLOBIN (HGB A1C): Hemoglobin A1C: 7.9

## 2017-06-03 MED ORDER — PIOGLITAZONE HCL 45 MG PO TABS: 45.0000 mg | ORAL_TABLET | Freq: Every day | ORAL | 3 refills | Status: DC

## 2017-06-03 NOTE — Progress Notes (Signed)
Subjective:    Patient ID: Luis Hammond, male    DOB: [birth year], 77 y.o.   MRN: [id number]  HPI Pt returns for f/u of diabetes mellitus: DM type: 2 Dx'ed: 7353 Complications: CAD and renal insuff Therapy: 4 oral meds.   DKA: never.   Severe hypoglycemia: never.  Pancreatitis: never.   Other: he took insulin for a brief time in approx 2007; he did not tolerate metformin-XR (diarrhea).   Interval history: no cbg record, but states cbg's vary from 135-155.  He takes meds as rx'ed.  pt states he feels well in general.   Past Medical History:  Diagnosis Date  . Arthritis    Rheumatoid arthritis- being treated  . Cancer [facility])    colon cancer(surgery only)- 3 yrs ago ([place])  . Coronary artery disease   . Diabetes ([place])   . History of kidney stones    x 3-4 episodes-passed  . Hyperlipidemia   . Hypertension     Past Surgical History:  Procedure Laterality Date  . BACK SURGERY     '01- herniated disc  . BLEPHAROPLASTY Bilateral   . COLON SURGERY     for colon cancer 3 yrs ago -[place]  . COLONOSCOPY WITH PROPOFOL N/A 10/14/2015   Procedure: COLONOSCOPY WITH PROPOFOL;  Surgeon: Garlan Fair, MD;  Location: [facility];  Service: Endoscopy;  Laterality: N/A;  . CORONARY ANGIOPLASTY     '01- X3 stents placed- no problems since- Dr. Priscille Heidelberg  . EYE SURGERY    . JOINT REPLACEMENT Left    LTKA-([place])  . TONSILLECTOMY      Social History   Socioeconomic History  . Marital status: Married    Spouse name: Not on file  . Number of children: Not on file  . Years of education: Not on file  . Highest education level: Not on file  Social Needs  . Financial resource strain: Not on file  . Food insecurity - worry: Not on file  . Food insecurity - inability: Not on file  . Transportation needs - medical: Not on file  . Transportation needs - non-medical: Not on file  Occupational History  . Not on file  Tobacco Use  . Smoking status: Never Smoker  .  Smokeless tobacco: Never Used  Substance and Sexual Activity  . Alcohol use: No    Alcohol/week: 0.0 oz  . Drug use: No  . Sexual activity: Not on file  Other Topics Concern  . Not on file  Social History Narrative   Epworth Sleepiness Scale - 7 (as of 05/09/15)    Current Outpatient Medications on File Prior to Visit  Medication Sig Dispense Refill  . acetaminophen (TYLENOL) 500 MG tablet Take 1,500 mg by mouth every 6 (six) hours as needed for mild pain.    Marland Kitchen amLODipine (NORVASC) 10 MG tablet Take 1 tablet (10 mg total) by mouth daily. 90 tablet 2  . aspirin EC 81 MG tablet Take 81 mg by mouth daily.    . Cholecalciferol (VITAMIN D-3) 1000 UNITS CAPS Take 2,000 Units by mouth daily.     . empagliflozin (JARDIANCE) 25 MG TABS tablet Take 25 mg by mouth daily. 90 tablet 3  . ezetimibe (ZETIA) 10 MG tablet Take 1 tablet (10 mg total) by mouth daily. 90 tablet 2  . ferrous sulfate 325 (65 FE) MG tablet Take 325 mg by mouth daily with breakfast.    . folic acid (FOLVITE) 1 MG tablet Take 1 mg by mouth daily.    Marland Kitchen  JANUVIA 100 MG tablet TAKE 1 TABLET BY MOUTH ONCE DAILY 90 tablet 3  . losartan (COZAAR) 100 MG tablet Take 1 tablet (100 mg total) by mouth daily. 90 tablet 2  . methotrexate (RHEUMATREX) 2.5 MG tablet Take 7.5 mg by mouth every Monday. Caution:Chemotherapy. Protect from light.    . metoprolol succinate (TOPROL-XL) 25 MG 24 hr tablet Take 1 tablet (25 mg total) by mouth daily. 90 tablet 2  . naproxen sodium (ANAPROX) 220 MG tablet Take 440 mg by mouth daily as needed (pain).     . polyvinyl alcohol (LIQUIFILM TEARS) 1.4 % ophthalmic solution Place 2 drops into both eyes as needed for dry eyes.    . repaglinide (PRANDIN) 0.5 MG tablet Take 1 tablet (0.5 mg total) by mouth 3 (three) times daily before meals. (Patient taking differently: Take 0.5 mg by mouth daily. qam before breakfast) 270 tablet 3  . RiTUXimab (RITUXAN IV) Inject into the vein every 6 (six) months.     No current  facility-administered medications on file prior to visit.     Allergies  Allergen Reactions  . Ioxaglate Anaphylaxis and Swelling  . Ivp Dye [Iodinated Diagnostic Agents] Anaphylaxis and Swelling  . Metformin And Related Diarrhea  . Onion Hives and Swelling  . Penicillins Itching and Rash    Has patient had a PCN reaction causing immediate rash, facial/tongue/throat swelling, SOB or lightheadedness with hypotension: yes Has patient had a PCN reaction causing severe rash involving mucus membranes or skin necrosis: no Has patient had a PCN reaction that required hospitalization yes - was in hospital when reaction occurred Has patient had a PCN reaction occurring within the last 10 years: no If all of the above answers are "NO", then may proceed with Cephalosporin use.     Family History  Problem Relation Age of Onset  . Diabetes Father   . Diabetes Sister   . Diabetes Brother   . Other Brother        SIGHT ISSUES  . Heart disease Mother   . Dementia Mother   . Diabetes Sister   . Kidney disease Sister   . Diabetes Sister     BP (!) 182/92 (BP Location: Left Arm, Patient Position: Sitting, Cuff Size: Normal)   Pulse (!) 52   Wt 254 lb (115.2 kg)   SpO2 95%   BMI 32.61 kg/m    Review of Systems He denies hypoglycemia.     Objective:   Physical Exam VITAL SIGNS:  See vs page GENERAL: no distress Pulses: foot pulses are intact bilaterally.   MSK: no deformity of the feet or ankles.  CV: no edema of the legs or ankles Skin:  no ulcer on the feet or ankles.  normal color and temp on the feet and ankles.  Neuro: sensation is intact to touch on the feet and ankles.   Ext: There is bilateral onychomycosis of the toenails.  Both great toenails are absent  Lab Results  Component Value Date   CREATININE 1.24 12/16/2016   BUN 15 12/16/2016   NA 140 12/16/2016   K 3.6 12/16/2016   CL 107 12/16/2016   CO2 24 12/16/2016   A1c=7.9%     Assessment & Plan:  Type 2 DM,  with CAD: Worse Renal insuff: this limits rx options  Patient Instructions  check your blood sugar once a day.  vary the time of day when you check, between before the 3 meals, and at bedtime.  also check if you  have symptoms of your blood sugar being too high or too low.  please keep a record of the readings and bring it to your next appointment here (or you can bring the meter itself).  You can write it on any piece of paper.  please call us sooner if your blood sugar goes below 70, or if you have a lot of readings over 200.  Please continue the same other medications for diabetes.   I have sent a prescription to your pharmacy, to increase the pioglitizone.  Please come back for a follow-up appointment in 3 months.

## 2017-06-03 NOTE — Patient Instructions (Addendum)
check your blood sugar once a day.  vary the time of day when you check, between before the 3 meals, and at bedtime.  also check if you have symptoms of your blood sugar being too high or too low.  please keep a record of the readings and bring it to your next appointment here (or you can bring the meter itself).  You can write it on any piece of paper.  please call us sooner if your blood sugar goes below 70, or if you have a lot of readings over 200.  Please continue the same other medications for diabetes.   I have sent a prescription to your pharmacy, to increase the pioglitizone.  Please come back for a follow-up appointment in 3 months.

## 2017-06-07 DIAGNOSIS — R1031 Right lower quadrant pain: Secondary | ICD-10-CM | POA: Diagnosis not present

## 2017-06-07 DIAGNOSIS — I700 Atherosclerosis of aorta: Secondary | ICD-10-CM | POA: Diagnosis not present

## 2017-06-07 DIAGNOSIS — C182 Malignant neoplasm of ascending colon: Secondary | ICD-10-CM | POA: Diagnosis not present

## 2017-06-07 DIAGNOSIS — I119 Hypertensive heart disease without heart failure: Secondary | ICD-10-CM | POA: Diagnosis not present

## 2017-06-07 DIAGNOSIS — I2510 Atherosclerotic heart disease of native coronary artery without angina pectoris: Secondary | ICD-10-CM | POA: Diagnosis not present

## 2017-06-07 DIAGNOSIS — E782 Mixed hyperlipidemia: Secondary | ICD-10-CM | POA: Diagnosis not present

## 2017-06-07 DIAGNOSIS — Z23 Encounter for immunization: Secondary | ICD-10-CM | POA: Diagnosis not present

## 2017-06-08 DIAGNOSIS — C182 Malignant neoplasm of ascending colon: Secondary | ICD-10-CM | POA: Diagnosis not present

## 2017-06-08 DIAGNOSIS — R0602 Shortness of breath: Secondary | ICD-10-CM | POA: Diagnosis not present

## 2017-06-08 DIAGNOSIS — R0789 Other chest pain: Secondary | ICD-10-CM | POA: Diagnosis not present

## 2017-06-08 DIAGNOSIS — N201 Calculus of ureter: Secondary | ICD-10-CM | POA: Diagnosis not present

## 2017-06-08 DIAGNOSIS — R9431 Abnormal electrocardiogram [ECG] [EKG]: Secondary | ICD-10-CM | POA: Diagnosis not present

## 2017-06-08 DIAGNOSIS — I10 Essential (primary) hypertension: Secondary | ICD-10-CM | POA: Diagnosis not present

## 2017-06-08 DIAGNOSIS — I2510 Atherosclerotic heart disease of native coronary artery without angina pectoris: Secondary | ICD-10-CM | POA: Diagnosis not present

## 2017-06-08 DIAGNOSIS — N400 Enlarged prostate without lower urinary tract symptoms: Secondary | ICD-10-CM | POA: Diagnosis not present

## 2017-06-08 DIAGNOSIS — E782 Mixed hyperlipidemia: Secondary | ICD-10-CM | POA: Diagnosis not present

## 2017-07-28 ENCOUNTER — Other Ambulatory Visit: Payer: Self-pay | Admitting: Endocrinology

## 2017-08-02 DIAGNOSIS — L603 Nail dystrophy: Secondary | ICD-10-CM | POA: Diagnosis not present

## 2017-08-02 DIAGNOSIS — E1151 Type 2 diabetes mellitus with diabetic peripheral angiopathy without gangrene: Secondary | ICD-10-CM | POA: Diagnosis not present

## 2017-08-02 DIAGNOSIS — L84 Corns and callosities: Secondary | ICD-10-CM | POA: Diagnosis not present

## 2017-08-02 DIAGNOSIS — E119 Type 2 diabetes mellitus without complications: Secondary | ICD-10-CM | POA: Diagnosis not present

## 2017-08-02 DIAGNOSIS — I739 Peripheral vascular disease, unspecified: Secondary | ICD-10-CM | POA: Diagnosis not present

## 2017-08-05 ENCOUNTER — Other Ambulatory Visit: Payer: Self-pay

## 2017-08-05 MED ORDER — PIOGLITAZONE HCL 45 MG PO TABS: 45.0000 mg | ORAL_TABLET | Freq: Every day | ORAL | 3 refills | Status: AC

## 2017-08-26 DIAGNOSIS — M79672 Pain in left foot: Secondary | ICD-10-CM | POA: Diagnosis not present

## 2017-08-26 DIAGNOSIS — M79671 Pain in right foot: Secondary | ICD-10-CM | POA: Diagnosis not present

## 2017-08-26 DIAGNOSIS — L600 Ingrowing nail: Secondary | ICD-10-CM | POA: Diagnosis not present

## 2017-09-01 ENCOUNTER — Ambulatory Visit (INDEPENDENT_AMBULATORY_CARE_PROVIDER_SITE_OTHER): Payer: Medicare Other | Admitting: Endocrinology

## 2017-09-01 ENCOUNTER — Encounter: Payer: Self-pay | Admitting: Endocrinology

## 2017-09-01 VITALS — Wt 256.4 lb

## 2017-09-01 DIAGNOSIS — E1159 Type 2 diabetes mellitus with other circulatory complications: Secondary | ICD-10-CM

## 2017-09-01 LAB — POCT GLYCOSYLATED HEMOGLOBIN (HGB A1C): Hemoglobin A1C: 7.3

## 2017-09-01 MED ORDER — REPAGLINIDE 0.5 MG PO TABS: 0.5000 mg | ORAL_TABLET | Freq: Three times a day (TID) | ORAL | 3 refills | Status: DC

## 2017-09-01 NOTE — Progress Notes (Signed)
Subjective:    Patient ID: Luis Hammond, male    DOB: [birth year], 77 y.o.   MRN: [id number]  HPI Pt returns for f/u of diabetes mellitus: DM type: 2 Dx'ed: 3235 Complications: CAD and renal insuff Therapy: 4 oral meds.   DKA: never.   Severe hypoglycemia: never.  Pancreatitis: never.   Other: he took insulin for a brief time in approx 2007; he did not tolerate metformin-XR (diarrhea).   Interval history: no cbg record, but states cbg's are well-controlled.  He takes prandin only qd.   Past Medical History:  Diagnosis Date  . Arthritis    Rheumatoid arthritis- being treated  . Cancer [facility])    colon cancer(surgery only)- 3 yrs ago ([place])  . Coronary artery disease   . Diabetes ([place])   . History of kidney stones    x 3-4 episodes-passed  . Hyperlipidemia   . Hypertension     Past Surgical History:  Procedure Laterality Date  . BACK SURGERY     '01- herniated disc  . BLEPHAROPLASTY Bilateral   . COLON SURGERY     for colon cancer 3 yrs ago [place]  . COLONOSCOPY WITH PROPOFOL N/A 10/14/2015   Procedure: COLONOSCOPY WITH PROPOFOL;  Surgeon: Garlan Fair, MD;  Location: [facility];  Service: Endoscopy;  Laterality: N/A;  . CORONARY ANGIOPLASTY     '01- X3 stents placed- no problems since- Dr. Priscille Heidelberg  . EYE SURGERY    . JOINT REPLACEMENT Left    LTKA-([place])  . TONSILLECTOMY      Social History   Socioeconomic History  . Marital status: Married    Spouse name: Not on file  . Number of children: Not on file  . Years of education: Not on file  . Highest education level: Not on file  Occupational History  . Not on file  Social Needs  . Financial resource strain: Not on file  . Food insecurity:    Worry: Not on file    Inability: Not on file  . Transportation needs:    Medical: Not on file    Non-medical: Not on file  Tobacco Use  . Smoking status: Never Smoker  . Smokeless tobacco: Never Used  Substance and Sexual Activity  .  Alcohol use: No    Alcohol/week: 0.0 oz  . Drug use: No  . Sexual activity: Not on file  Lifestyle  . Physical activity:    Days per week: Not on file    Minutes per session: Not on file  . Stress: Not on file  Relationships  . Social connections:    Talks on phone: Not on file    Gets together: Not on file    Attends religious service: Not on file    Active member of club or organization: Not on file    Attends meetings of clubs or organizations: Not on file    Relationship status: Not on file  . Intimate partner violence:    Fear of current or ex partner: Not on file    Emotionally abused: Not on file    Physically abused: Not on file    Forced sexual activity: Not on file  Other Topics Concern  . Not on file  Social History Narrative   Epworth Sleepiness Scale - 7 (as of 05/09/15)    Current Outpatient Medications on File Prior to Visit  Medication Sig Dispense Refill  . acetaminophen (TYLENOL) 500 MG tablet Take 1,500 mg by mouth every 6 (six) hours as  needed for mild pain.    Marland Kitchen amLODipine (NORVASC) 10 MG tablet Take 1 tablet (10 mg total) by mouth daily. 90 tablet 2  . aspirin EC 81 MG tablet Take 81 mg by mouth daily.    . Cholecalciferol (VITAMIN D-3) 1000 UNITS CAPS Take 2,000 Units by mouth daily.     . empagliflozin (JARDIANCE) 25 MG TABS tablet Take 25 mg by mouth daily. 90 tablet 3  . ezetimibe (ZETIA) 10 MG tablet Take 1 tablet (10 mg total) by mouth daily. 90 tablet 2  . ferrous sulfate 325 (65 FE) MG tablet Take 325 mg by mouth daily with breakfast.    . fluticasone (FLONASE) 50 MCG/ACT nasal spray USE 2 PUFFS IN EACH NOSTRIL EVERY DAY 16 g 2  . folic acid (FOLVITE) 1 MG tablet Take 1 mg by mouth daily.    Marland Kitchen JANUVIA 100 MG tablet TAKE 1 TABLET BY MOUTH ONCE DAILY 90 tablet 3  . losartan (COZAAR) 100 MG tablet Take 1 tablet (100 mg total) by mouth daily. 90 tablet 2  . methotrexate (RHEUMATREX) 2.5 MG tablet Take 7.5 mg by mouth every Monday.  Caution:Chemotherapy. Protect from light.    . metoprolol succinate (TOPROL-XL) 25 MG 24 hr tablet Take 1 tablet (25 mg total) by mouth daily. 90 tablet 2  . naproxen sodium (ANAPROX) 220 MG tablet Take 440 mg by mouth daily as needed (pain).     . pioglitazone (ACTOS) 45 MG tablet Take 1 tablet (45 mg total) by mouth daily. 90 tablet 3  . polyvinyl alcohol (LIQUIFILM TEARS) 1.4 % ophthalmic solution Place 2 drops into both eyes as needed for dry eyes.    . RiTUXimab (RITUXAN IV) Inject into the vein every 6 (six) months.     No current facility-administered medications on file prior to visit.     Allergies  Allergen Reactions  . Ioxaglate Anaphylaxis and Swelling  . Ivp Dye [Iodinated Diagnostic Agents] Anaphylaxis and Swelling  . Metformin And Related Diarrhea  . Onion Hives and Swelling  . Penicillins Itching and Rash    Has patient had a PCN reaction causing immediate rash, facial/tongue/throat swelling, SOB or lightheadedness with hypotension: yes Has patient had a PCN reaction causing severe rash involving mucus membranes or skin necrosis: no Has patient had a PCN reaction that required hospitalization yes - was in hospital when reaction occurred Has patient had a PCN reaction occurring within the last 10 years: no If all of the above answers are "NO", then may proceed with Cephalosporin use.     Family History  Problem Relation Age of Onset  . Diabetes Father   . Diabetes Sister   . Diabetes Brother   . Other Brother        SIGHT ISSUES  . Heart disease Mother   . Dementia Mother   . Diabetes Sister   . Kidney disease Sister   . Diabetes Sister     Wt 256 lb 6.4 oz (116.3 kg)   SpO2 96%   BMI 32.92 kg/m    Review of Systems He denies hypoglycemia    Objective:   Physical Exam VITAL SIGNS:  See vs page GENERAL: no distress Pulses: foot pulses are intact bilaterally.   MSK: no deformity of the feet or ankles.  CV: trace bilat edema of the legs or  ankles Skin:  no ulcer on the feet or ankles.  normal color and temp on the feet and ankles.  Neuro: sensation is intact to touch  on the feet and ankles.   Ext: There is bilateral onychomycosis of the toenails.  Both great toenails are absent  A1c=7.3%    Assessment & Plan:  Edema: recurrent: we'll continue the pioglitazone for now, but we may have to reduce Type 2 DM: improved, but The pattern of his cbg's indicates he needs some adjustment in his therapy.    Patient Instructions  check your blood sugar once a day.  vary the time of day when you check, between before the 3 meals, and at bedtime.  also check if you have symptoms of your blood sugar being too high or too low.  please keep a record of the readings and bring it to your next appointment here (or you can bring the meter itself).  You can write it on any piece of paper.  please call us sooner if your blood sugar goes below 70, or if you have a lot of readings over 200.  Please continue the same other medications for diabetes.   I have sent a prescription to your pharmacy, to increase the repaglinide.  Please come back for a follow-up appointment in 4 months.

## 2017-09-01 NOTE — Patient Instructions (Addendum)
check your blood sugar once a day.  vary the time of day when you check, between before the 3 meals, and at bedtime.  also check if you have symptoms of your blood sugar being too high or too low.  please keep a record of the readings and bring it to your next appointment here (or you can bring the meter itself).  You can write it on any piece of paper.  please call us sooner if your blood sugar goes below 70, or if you have a lot of readings over 200.  Please continue the same other medications for diabetes.   I have sent a prescription to your pharmacy, to increase the repaglinide.  Please come back for a follow-up appointment in 4 months.

## 2017-09-13 DIAGNOSIS — H6121 Impacted cerumen, right ear: Secondary | ICD-10-CM | POA: Diagnosis not present

## 2017-09-13 DIAGNOSIS — Z9622 Myringotomy tube(s) status: Secondary | ICD-10-CM | POA: Diagnosis not present

## 2017-09-13 DIAGNOSIS — H9212 Otorrhea, left ear: Secondary | ICD-10-CM | POA: Diagnosis not present

## 2017-09-13 DIAGNOSIS — H6982 Other specified disorders of Eustachian tube, left ear: Secondary | ICD-10-CM | POA: Diagnosis not present

## 2017-09-20 DIAGNOSIS — M0579 Rheumatoid arthritis with rheumatoid factor of multiple sites without organ or systems involvement: Secondary | ICD-10-CM | POA: Diagnosis not present

## 2017-09-20 DIAGNOSIS — E559 Vitamin D deficiency, unspecified: Secondary | ICD-10-CM | POA: Diagnosis not present

## 2017-09-20 DIAGNOSIS — R5383 Other fatigue: Secondary | ICD-10-CM | POA: Diagnosis not present

## 2017-09-21 DIAGNOSIS — M0579 Rheumatoid arthritis with rheumatoid factor of multiple sites without organ or systems involvement: Secondary | ICD-10-CM | POA: Diagnosis not present

## 2017-09-21 DIAGNOSIS — I2510 Atherosclerotic heart disease of native coronary artery without angina pectoris: Secondary | ICD-10-CM | POA: Diagnosis not present

## 2017-09-21 DIAGNOSIS — N182 Chronic kidney disease, stage 2 (mild): Secondary | ICD-10-CM | POA: Diagnosis not present

## 2017-09-21 DIAGNOSIS — N401 Enlarged prostate with lower urinary tract symptoms: Secondary | ICD-10-CM | POA: Diagnosis not present

## 2017-09-21 DIAGNOSIS — E1122 Type 2 diabetes mellitus with diabetic chronic kidney disease: Secondary | ICD-10-CM | POA: Diagnosis not present

## 2017-09-21 DIAGNOSIS — D638 Anemia in other chronic diseases classified elsewhere: Secondary | ICD-10-CM | POA: Diagnosis not present

## 2017-09-21 DIAGNOSIS — Z87442 Personal history of urinary calculi: Secondary | ICD-10-CM | POA: Diagnosis not present

## 2017-09-21 DIAGNOSIS — D72829 Elevated white blood cell count, unspecified: Secondary | ICD-10-CM | POA: Diagnosis not present

## 2017-09-21 DIAGNOSIS — I119 Hypertensive heart disease without heart failure: Secondary | ICD-10-CM | POA: Diagnosis not present

## 2017-09-21 DIAGNOSIS — E6609 Other obesity due to excess calories: Secondary | ICD-10-CM | POA: Diagnosis not present

## 2017-09-21 DIAGNOSIS — Z85038 Personal history of other malignant neoplasm of large intestine: Secondary | ICD-10-CM | POA: Diagnosis not present

## 2017-09-21 DIAGNOSIS — I700 Atherosclerosis of aorta: Secondary | ICD-10-CM | POA: Diagnosis not present

## 2017-10-04 DIAGNOSIS — R5383 Other fatigue: Secondary | ICD-10-CM | POA: Diagnosis not present

## 2017-10-04 DIAGNOSIS — E559 Vitamin D deficiency, unspecified: Secondary | ICD-10-CM | POA: Diagnosis not present

## 2017-10-04 DIAGNOSIS — M0579 Rheumatoid arthritis with rheumatoid factor of multiple sites without organ or systems involvement: Secondary | ICD-10-CM | POA: Diagnosis not present

## 2017-10-04 DIAGNOSIS — D638 Anemia in other chronic diseases classified elsewhere: Secondary | ICD-10-CM | POA: Diagnosis not present

## 2017-10-19 ENCOUNTER — Other Ambulatory Visit: Payer: Self-pay | Admitting: Endocrinology

## 2017-10-20 ENCOUNTER — Other Ambulatory Visit: Payer: Self-pay | Admitting: Endocrinology

## 2017-10-21 DIAGNOSIS — J329 Chronic sinusitis, unspecified: Secondary | ICD-10-CM | POA: Diagnosis not present

## 2017-10-21 DIAGNOSIS — H6505 Acute serous otitis media, recurrent, left ear: Secondary | ICD-10-CM | POA: Diagnosis not present

## 2017-10-21 DIAGNOSIS — H6982 Other specified disorders of Eustachian tube, left ear: Secondary | ICD-10-CM | POA: Diagnosis not present

## 2017-10-21 DIAGNOSIS — H9012 Conductive hearing loss, unilateral, left ear, with unrestricted hearing on the contralateral side: Secondary | ICD-10-CM | POA: Diagnosis not present

## 2017-10-27 ENCOUNTER — Other Ambulatory Visit: Payer: Self-pay | Admitting: Endocrinology

## 2017-11-04 DIAGNOSIS — L603 Nail dystrophy: Secondary | ICD-10-CM | POA: Diagnosis not present

## 2017-11-04 DIAGNOSIS — I739 Peripheral vascular disease, unspecified: Secondary | ICD-10-CM | POA: Diagnosis not present

## 2017-11-04 DIAGNOSIS — L84 Corns and callosities: Secondary | ICD-10-CM | POA: Diagnosis not present

## 2017-11-04 DIAGNOSIS — E1151 Type 2 diabetes mellitus with diabetic peripheral angiopathy without gangrene: Secondary | ICD-10-CM | POA: Diagnosis not present

## 2017-11-05 DIAGNOSIS — J324 Chronic pansinusitis: Secondary | ICD-10-CM | POA: Diagnosis not present

## 2017-11-05 DIAGNOSIS — J329 Chronic sinusitis, unspecified: Secondary | ICD-10-CM | POA: Diagnosis not present

## 2017-11-05 DIAGNOSIS — H9012 Conductive hearing loss, unilateral, left ear, with unrestricted hearing on the contralateral side: Secondary | ICD-10-CM | POA: Diagnosis not present

## 2017-11-05 DIAGNOSIS — H6982 Other specified disorders of Eustachian tube, left ear: Secondary | ICD-10-CM | POA: Diagnosis not present

## 2017-11-23 ENCOUNTER — Ambulatory Visit (INDEPENDENT_AMBULATORY_CARE_PROVIDER_SITE_OTHER): Payer: Medicare Other | Admitting: Endocrinology

## 2017-11-23 ENCOUNTER — Encounter: Payer: Self-pay | Admitting: Endocrinology

## 2017-11-23 VITALS — BP 150/84 | HR 58 | Wt 252.0 lb

## 2017-11-23 DIAGNOSIS — E1159 Type 2 diabetes mellitus with other circulatory complications: Secondary | ICD-10-CM | POA: Diagnosis not present

## 2017-11-23 LAB — POCT GLYCOSYLATED HEMOGLOBIN (HGB A1C): Hemoglobin A1C: 7.8 % — AB (ref 4.0–5.6)

## 2017-11-23 MED ORDER — NATEGLINIDE 60 MG PO TABS: 60.0000 mg | ORAL_TABLET | Freq: Three times a day (TID) | ORAL | 3 refills | Status: AC

## 2017-11-23 NOTE — Progress Notes (Signed)
Subjective:    Patient ID: Luis Hammond, male    DOB: [birth year], 77 y.o.   MRN: [id number]  HPI Pt returns for f/u of diabetes mellitus: DM type: 2 Dx'ed: 7673 Complications: CAD and renal insuff Therapy: 4 oral meds.   DKA: never.   Severe hypoglycemia: never.  Pancreatitis: never.   Other: he took insulin for a brief time in approx 2007; he did not tolerate metformin-XR (diarrhea).   Interval history: no cbg record, but states cbg's are well-controlled.  He says prandin is causing nasal congestion and cough.  He stopped it, and sxs resolved.   Past Medical History:  Diagnosis Date  . Arthritis    Rheumatoid arthritis- being treated  . Cancer [facility])    colon cancer(surgery only)- 3 yrs ago ([place])  . Coronary artery disease   . Diabetes ([place])   . History of kidney stones    x 3-4 episodes-passed  . Hyperlipidemia   . Hypertension     Past Surgical History:  Procedure Laterality Date  . BACK SURGERY     '01- herniated disc  . BLEPHAROPLASTY Bilateral   . COLON SURGERY     for colon cancer 3 yrs ago [place]  . COLONOSCOPY WITH PROPOFOL N/A 10/14/2015   Procedure: COLONOSCOPY WITH PROPOFOL;  Surgeon: Garlan Fair, MD;  Location: [facility];  Service: Endoscopy;  Laterality: N/A;  . CORONARY ANGIOPLASTY     '01- X3 stents placed- no problems since- Dr. Priscille Heidelberg  . EYE SURGERY    . JOINT REPLACEMENT Left    LTKA-([place])  . TONSILLECTOMY      Social History   Socioeconomic History  . Marital status: Married    Spouse name: Not on file  . Number of children: Not on file  . Years of education: Not on file  . Highest education level: Not on file  Occupational History  . Not on file  Social Needs  . Financial resource strain: Not on file  . Food insecurity:    Worry: Not on file    Inability: Not on file  . Transportation needs:    Medical: Not on file    Non-medical: Not on file  Tobacco Use  . Smoking status: Never Smoker  .  Smokeless tobacco: Never Used  Substance and Sexual Activity  . Alcohol use: No    Alcohol/week: 0.0 oz  . Drug use: No  . Sexual activity: Not on file  Lifestyle  . Physical activity:    Days per week: Not on file    Minutes per session: Not on file  . Stress: Not on file  Relationships  . Social connections:    Talks on phone: Not on file    Gets together: Not on file    Attends religious service: Not on file    Active member of club or organization: Not on file    Attends meetings of clubs or organizations: Not on file    Relationship status: Not on file  . Intimate partner violence:    Fear of current or ex partner: Not on file    Emotionally abused: Not on file    Physically abused: Not on file    Forced sexual activity: Not on file  Other Topics Concern  . Not on file  Social History Narrative   Epworth Sleepiness Scale - 7 (as of 05/09/15)    Current Outpatient Medications on File Prior to Visit  Medication Sig Dispense Refill  . acetaminophen (TYLENOL) 500 MG  tablet Take 1,500 mg by mouth every 6 (six) hours as needed for mild pain.    Marland Kitchen amLODipine (NORVASC) 10 MG tablet Take 1 tablet (10 mg total) by mouth daily. 90 tablet 2  . aspirin EC 81 MG tablet Take 81 mg by mouth daily.    . Cholecalciferol (VITAMIN D-3) 1000 UNITS CAPS Take 2,000 Units by mouth daily.     . empagliflozin (JARDIANCE) 25 MG TABS tablet Take 25 mg by mouth daily. 90 tablet 3  . ezetimibe (ZETIA) 10 MG tablet Take 1 tablet (10 mg total) by mouth daily. 90 tablet 2  . ferrous sulfate 325 (65 FE) MG tablet Take 325 mg by mouth daily with breakfast.    . fluticasone (FLONASE) 50 MCG/ACT nasal spray SPRAY 2 SPRAYS INTO EACH NOSTRIL EVERY DAY 16 g 2  . folic acid (FOLVITE) 1 MG tablet Take 1 mg by mouth daily.    Marland Kitchen JANUVIA 100 MG tablet TAKE 1 TABLET BY MOUTH ONCE DAILY 90 tablet 3  . losartan (COZAAR) 100 MG tablet Take 1 tablet (100 mg total) by mouth daily. 90 tablet 2  . methotrexate  (RHEUMATREX) 2.5 MG tablet Take 7.5 mg by mouth every Monday. Caution:Chemotherapy. Protect from light.    . metoprolol succinate (TOPROL-XL) 25 MG 24 hr tablet Take 1 tablet (25 mg total) by mouth daily. 90 tablet 2  . naproxen sodium (ANAPROX) 220 MG tablet Take 440 mg by mouth daily as needed (pain).     . pioglitazone (ACTOS) 45 MG tablet Take 1 tablet (45 mg total) by mouth daily. 90 tablet 3  . polyvinyl alcohol (LIQUIFILM TEARS) 1.4 % ophthalmic solution Place 2 drops into both eyes as needed for dry eyes.    . RiTUXimab (RITUXAN IV) Inject into the vein every 6 (six) months.     No current facility-administered medications on file prior to visit.     Allergies  Allergen Reactions  . Ioxaglate Anaphylaxis and Swelling  . Ivp Dye [Iodinated Diagnostic Agents] Anaphylaxis and Swelling  . Metformin And Related Diarrhea  . Onion Hives and Swelling  . Penicillins Itching and Rash    Has patient had a PCN reaction causing immediate rash, facial/tongue/throat swelling, SOB or lightheadedness with hypotension: yes Has patient had a PCN reaction causing severe rash involving mucus membranes or skin necrosis: no Has patient had a PCN reaction that required hospitalization yes - was in hospital when reaction occurred Has patient had a PCN reaction occurring within the last 10 years: no If all of the above answers are "NO", then may proceed with Cephalosporin use.     Family History  Problem Relation Age of Onset  . Diabetes Father   . Diabetes Sister   . Diabetes Brother   . Other Brother        SIGHT ISSUES  . Heart disease Mother   . Dementia Mother   . Diabetes Sister   . Kidney disease Sister   . Diabetes Sister     BP (!) 150/84 (BP Location: Left Arm, Patient Position: Sitting, Cuff Size: Normal)   Pulse (!) 58   Wt 252 lb (114.3 kg)   SpO2 97%   BMI 32.35 kg/m    Review of Systems He denies hypoglycemia    Objective:   Physical Exam VITAL SIGNS:  See vs  page GENERAL: no distress Pulses: foot pulses are intact bilaterally.   MSK: no deformity of the feet or ankles.  CV: trace bilat edema of the  legs or ankles Skin:  no ulcer on the feet or ankles.  normal color and temp on the feet and ankles.  Neuro: sensation is intact to touch on the feet and ankles.   Ext: There is bilateral onychomycosis of the toenails.  Both great toenails are absent  A1c=7.8%     Assessment & Plan:  Type 2 DM, with renal insuff: worse.  Nasal congestion, new: pt says due to prandin.   Patient Instructions  check your blood sugar once a day.  vary the time of day when you check, between before the 3 meals, and at bedtime.  also check if you have symptoms of your blood sugar being too high or too low.  please keep a record of the readings and bring it to your next appointment here (or you can bring the meter itself).  You can write it on any piece of paper.  please call us sooner if your blood sugar goes below 70, or if you have a lot of readings over 200.  Please continue the same other medications for diabetes.   I have sent a prescription to your pharmacy, to add nateglinide.  Please come back for a follow-up appointment in 3-4 months.

## 2017-11-23 NOTE — Patient Instructions (Addendum)
check your blood sugar once a day.  vary the time of day when you check, between before the 3 meals, and at bedtime.  also check if you have symptoms of your blood sugar being too high or too low.  please keep a record of the readings and bring it to your next appointment here (or you can bring the meter itself).  You can write it on any piece of paper.  please call us sooner if your blood sugar goes below 70, or if you have a lot of readings over 200.  Please continue the same other medications for diabetes.   I have sent a prescription to your pharmacy, to add nateglinide.  Please come back for a follow-up appointment in 3-4 months.

## 2017-11-26 ENCOUNTER — Other Ambulatory Visit: Payer: Self-pay

## 2017-11-26 ENCOUNTER — Telehealth: Payer: Self-pay | Admitting: Endocrinology

## 2017-11-26 MED ORDER — ACCU-CHEK FASTCLIX LANCETS MISC: 11 refills | Status: AC

## 2017-11-26 MED ORDER — GLUCOSE BLOOD VI STRP
ORAL_STRIP | 11 refills | Status: AC
Start: 2017-11-26 — End: ?

## 2017-11-26 MED ORDER — ACCU-CHEK GUIDE W/DEVICE KIT
1.0000 | PACK | Freq: Every day | 0 refills | Status: AC | PRN
Start: 2017-11-26 — End: ?

## 2017-11-26 NOTE — Telephone Encounter (Signed)
I called patient & he stated that his current accu chek meter was d/c. He needed a new one sent to pharmacy & I have sent in accu chek guide meter with strips & lancets.

## 2017-11-26 NOTE — Telephone Encounter (Signed)
Patient needs RX for Glucose Machine sent to [facility] on [address]-per Dr. Loanne Drilling at last appointment

## 2017-12-05 DIAGNOSIS — H1033 Unspecified acute conjunctivitis, bilateral: Secondary | ICD-10-CM | POA: Diagnosis not present

## 2017-12-05 DIAGNOSIS — I1310 Hypertensive heart and chronic kidney disease without heart failure, with stage 1 through stage 4 chronic kidney disease, or unspecified chronic kidney disease: Secondary | ICD-10-CM | POA: Diagnosis not present

## 2017-12-05 DIAGNOSIS — E1122 Type 2 diabetes mellitus with diabetic chronic kidney disease: Secondary | ICD-10-CM | POA: Diagnosis not present

## 2017-12-05 DIAGNOSIS — I2510 Atherosclerotic heart disease of native coronary artery without angina pectoris: Secondary | ICD-10-CM | POA: Diagnosis not present

## 2017-12-05 DIAGNOSIS — Z8673 Personal history of transient ischemic attack (TIA), and cerebral infarction without residual deficits: Secondary | ICD-10-CM | POA: Diagnosis not present

## 2017-12-05 DIAGNOSIS — N400 Enlarged prostate without lower urinary tract symptoms: Secondary | ICD-10-CM | POA: Diagnosis not present

## 2017-12-05 DIAGNOSIS — E782 Mixed hyperlipidemia: Secondary | ICD-10-CM | POA: Diagnosis not present

## 2017-12-05 DIAGNOSIS — M069 Rheumatoid arthritis, unspecified: Secondary | ICD-10-CM | POA: Diagnosis not present

## 2017-12-05 DIAGNOSIS — Z955 Presence of coronary angioplasty implant and graft: Secondary | ICD-10-CM | POA: Diagnosis not present

## 2017-12-05 DIAGNOSIS — Z85030 Personal history of malignant carcinoid tumor of large intestine: Secondary | ICD-10-CM | POA: Diagnosis not present

## 2017-12-05 DIAGNOSIS — N182 Chronic kidney disease, stage 2 (mild): Secondary | ICD-10-CM | POA: Diagnosis not present

## 2017-12-06 DIAGNOSIS — H1013 Acute atopic conjunctivitis, bilateral: Secondary | ICD-10-CM | POA: Diagnosis not present

## 2017-12-06 DIAGNOSIS — E119 Type 2 diabetes mellitus without complications: Secondary | ICD-10-CM | POA: Diagnosis not present

## 2017-12-06 DIAGNOSIS — Z880 Allergy status to penicillin: Secondary | ICD-10-CM | POA: Diagnosis not present

## 2017-12-06 DIAGNOSIS — H269 Unspecified cataract: Secondary | ICD-10-CM | POA: Diagnosis not present

## 2017-12-06 DIAGNOSIS — H2513 Age-related nuclear cataract, bilateral: Secondary | ICD-10-CM | POA: Diagnosis not present

## 2017-12-27 DIAGNOSIS — H1045 Other chronic allergic conjunctivitis: Secondary | ICD-10-CM | POA: Diagnosis not present

## 2017-12-27 DIAGNOSIS — H524 Presbyopia: Secondary | ICD-10-CM | POA: Diagnosis not present

## 2017-12-27 DIAGNOSIS — H04202 Unspecified epiphora, left lacrimal gland: Secondary | ICD-10-CM | POA: Diagnosis not present

## 2017-12-27 DIAGNOSIS — H2513 Age-related nuclear cataract, bilateral: Secondary | ICD-10-CM | POA: Diagnosis not present

## 2017-12-28 DIAGNOSIS — I2510 Atherosclerotic heart disease of native coronary artery without angina pectoris: Secondary | ICD-10-CM | POA: Diagnosis not present

## 2017-12-28 DIAGNOSIS — I119 Hypertensive heart disease without heart failure: Secondary | ICD-10-CM | POA: Diagnosis not present

## 2017-12-28 DIAGNOSIS — N182 Chronic kidney disease, stage 2 (mild): Secondary | ICD-10-CM | POA: Diagnosis not present

## 2017-12-28 DIAGNOSIS — E6609 Other obesity due to excess calories: Secondary | ICD-10-CM | POA: Diagnosis not present

## 2017-12-28 DIAGNOSIS — J9811 Atelectasis: Secondary | ICD-10-CM | POA: Diagnosis not present

## 2017-12-28 DIAGNOSIS — D72829 Elevated white blood cell count, unspecified: Secondary | ICD-10-CM | POA: Diagnosis not present

## 2017-12-28 DIAGNOSIS — R05 Cough: Secondary | ICD-10-CM | POA: Diagnosis not present

## 2017-12-28 DIAGNOSIS — D638 Anemia in other chronic diseases classified elsewhere: Secondary | ICD-10-CM | POA: Diagnosis not present

## 2017-12-28 DIAGNOSIS — Z85038 Personal history of other malignant neoplasm of large intestine: Secondary | ICD-10-CM | POA: Diagnosis not present

## 2017-12-28 DIAGNOSIS — E1122 Type 2 diabetes mellitus with diabetic chronic kidney disease: Secondary | ICD-10-CM | POA: Diagnosis not present

## 2017-12-28 DIAGNOSIS — M545 Low back pain: Secondary | ICD-10-CM | POA: Diagnosis not present

## 2017-12-28 DIAGNOSIS — Z87442 Personal history of urinary calculi: Secondary | ICD-10-CM | POA: Diagnosis not present

## 2017-12-28 DIAGNOSIS — N401 Enlarged prostate with lower urinary tract symptoms: Secondary | ICD-10-CM | POA: Diagnosis not present

## 2017-12-28 DIAGNOSIS — M0579 Rheumatoid arthritis with rheumatoid factor of multiple sites without organ or systems involvement: Secondary | ICD-10-CM | POA: Diagnosis not present

## 2017-12-28 DIAGNOSIS — I700 Atherosclerosis of aorta: Secondary | ICD-10-CM | POA: Diagnosis not present

## 2018-03-07 DIAGNOSIS — H6122 Impacted cerumen, left ear: Secondary | ICD-10-CM | POA: Diagnosis not present

## 2018-03-17 DIAGNOSIS — B351 Tinea unguium: Secondary | ICD-10-CM | POA: Diagnosis not present

## 2018-03-17 DIAGNOSIS — M0579 Rheumatoid arthritis with rheumatoid factor of multiple sites without organ or systems involvement: Secondary | ICD-10-CM | POA: Diagnosis not present

## 2018-03-17 DIAGNOSIS — D638 Anemia in other chronic diseases classified elsewhere: Secondary | ICD-10-CM | POA: Diagnosis not present

## 2018-03-17 DIAGNOSIS — G609 Hereditary and idiopathic neuropathy, unspecified: Secondary | ICD-10-CM | POA: Diagnosis not present

## 2018-03-17 DIAGNOSIS — I7090 Unspecified atherosclerosis: Secondary | ICD-10-CM | POA: Diagnosis not present

## 2018-03-17 DIAGNOSIS — L84 Corns and callosities: Secondary | ICD-10-CM | POA: Diagnosis not present

## 2018-03-17 DIAGNOSIS — I798 Other disorders of arteries, arterioles and capillaries in diseases classified elsewhere: Secondary | ICD-10-CM | POA: Diagnosis not present

## 2018-03-17 DIAGNOSIS — Z79899 Other long term (current) drug therapy: Secondary | ICD-10-CM | POA: Diagnosis not present

## 2018-03-17 DIAGNOSIS — E559 Vitamin D deficiency, unspecified: Secondary | ICD-10-CM | POA: Diagnosis not present

## 2018-03-21 DIAGNOSIS — I119 Hypertensive heart disease without heart failure: Secondary | ICD-10-CM | POA: Diagnosis not present

## 2018-03-21 DIAGNOSIS — N401 Enlarged prostate with lower urinary tract symptoms: Secondary | ICD-10-CM | POA: Diagnosis not present

## 2018-03-21 DIAGNOSIS — I2510 Atherosclerotic heart disease of native coronary artery without angina pectoris: Secondary | ICD-10-CM | POA: Diagnosis not present

## 2018-03-21 DIAGNOSIS — Z7189 Other specified counseling: Secondary | ICD-10-CM | POA: Diagnosis not present

## 2018-03-21 DIAGNOSIS — Z0000 Encounter for general adult medical examination without abnormal findings: Secondary | ICD-10-CM | POA: Diagnosis not present

## 2018-03-21 DIAGNOSIS — Z23 Encounter for immunization: Secondary | ICD-10-CM | POA: Diagnosis not present

## 2018-03-21 DIAGNOSIS — M0579 Rheumatoid arthritis with rheumatoid factor of multiple sites without organ or systems involvement: Secondary | ICD-10-CM | POA: Diagnosis not present

## 2018-03-21 DIAGNOSIS — E1122 Type 2 diabetes mellitus with diabetic chronic kidney disease: Secondary | ICD-10-CM | POA: Diagnosis not present

## 2018-03-21 DIAGNOSIS — I700 Atherosclerosis of aorta: Secondary | ICD-10-CM | POA: Diagnosis not present

## 2018-03-21 DIAGNOSIS — E6609 Other obesity due to excess calories: Secondary | ICD-10-CM | POA: Diagnosis not present

## 2018-03-21 DIAGNOSIS — Z85038 Personal history of other malignant neoplasm of large intestine: Secondary | ICD-10-CM | POA: Diagnosis not present

## 2018-03-21 DIAGNOSIS — N182 Chronic kidney disease, stage 2 (mild): Secondary | ICD-10-CM | POA: Diagnosis not present

## 2018-03-22 DIAGNOSIS — D519 Vitamin B12 deficiency anemia, unspecified: Secondary | ICD-10-CM | POA: Diagnosis not present

## 2018-03-22 DIAGNOSIS — R7989 Other specified abnormal findings of blood chemistry: Secondary | ICD-10-CM | POA: Diagnosis not present

## 2018-03-22 DIAGNOSIS — D500 Iron deficiency anemia secondary to blood loss (chronic): Secondary | ICD-10-CM | POA: Diagnosis not present

## 2018-03-22 DIAGNOSIS — Z0000 Encounter for general adult medical examination without abnormal findings: Secondary | ICD-10-CM | POA: Diagnosis not present

## 2018-03-22 DIAGNOSIS — D649 Anemia, unspecified: Secondary | ICD-10-CM | POA: Diagnosis not present

## 2018-03-22 DIAGNOSIS — E782 Mixed hyperlipidemia: Secondary | ICD-10-CM | POA: Diagnosis not present

## 2018-03-22 DIAGNOSIS — E1122 Type 2 diabetes mellitus with diabetic chronic kidney disease: Secondary | ICD-10-CM | POA: Diagnosis not present

## 2018-03-22 DIAGNOSIS — Z125 Encounter for screening for malignant neoplasm of prostate: Secondary | ICD-10-CM | POA: Diagnosis not present

## 2018-03-25 ENCOUNTER — Ambulatory Visit: Payer: Medicare Other | Admitting: Endocrinology

## 2018-03-25 DIAGNOSIS — Z0289 Encounter for other administrative examinations: Secondary | ICD-10-CM

## 2018-04-04 DIAGNOSIS — E782 Mixed hyperlipidemia: Secondary | ICD-10-CM | POA: Diagnosis not present

## 2018-04-04 DIAGNOSIS — R0789 Other chest pain: Secondary | ICD-10-CM | POA: Diagnosis not present

## 2018-04-04 DIAGNOSIS — N390 Urinary tract infection, site not specified: Secondary | ICD-10-CM | POA: Diagnosis not present

## 2018-04-04 DIAGNOSIS — E1122 Type 2 diabetes mellitus with diabetic chronic kidney disease: Secondary | ICD-10-CM | POA: Diagnosis not present

## 2018-04-04 DIAGNOSIS — E119 Type 2 diabetes mellitus without complications: Secondary | ICD-10-CM | POA: Diagnosis not present

## 2018-04-04 DIAGNOSIS — R9431 Abnormal electrocardiogram [ECG] [EKG]: Secondary | ICD-10-CM | POA: Diagnosis not present

## 2018-04-04 DIAGNOSIS — I10 Essential (primary) hypertension: Secondary | ICD-10-CM | POA: Diagnosis not present

## 2018-04-04 DIAGNOSIS — N401 Enlarged prostate with lower urinary tract symptoms: Secondary | ICD-10-CM | POA: Diagnosis not present

## 2018-04-04 DIAGNOSIS — Z85038 Personal history of other malignant neoplasm of large intestine: Secondary | ICD-10-CM | POA: Diagnosis not present

## 2018-04-04 DIAGNOSIS — D638 Anemia in other chronic diseases classified elsewhere: Secondary | ICD-10-CM | POA: Diagnosis not present

## 2018-04-04 DIAGNOSIS — R0602 Shortness of breath: Secondary | ICD-10-CM | POA: Diagnosis not present

## 2018-04-04 DIAGNOSIS — I700 Atherosclerosis of aorta: Secondary | ICD-10-CM | POA: Diagnosis not present

## 2018-04-04 DIAGNOSIS — I119 Hypertensive heart disease without heart failure: Secondary | ICD-10-CM | POA: Diagnosis not present

## 2018-04-04 DIAGNOSIS — Z87442 Personal history of urinary calculi: Secondary | ICD-10-CM | POA: Diagnosis not present

## 2018-04-04 DIAGNOSIS — N182 Chronic kidney disease, stage 2 (mild): Secondary | ICD-10-CM | POA: Diagnosis not present

## 2018-04-04 DIAGNOSIS — I2510 Atherosclerotic heart disease of native coronary artery without angina pectoris: Secondary | ICD-10-CM | POA: Diagnosis not present

## 2018-04-04 DIAGNOSIS — Z23 Encounter for immunization: Secondary | ICD-10-CM | POA: Diagnosis not present

## 2018-04-04 DIAGNOSIS — M0579 Rheumatoid arthritis with rheumatoid factor of multiple sites without organ or systems involvement: Secondary | ICD-10-CM | POA: Diagnosis not present

## 2018-04-04 DIAGNOSIS — E6609 Other obesity due to excess calories: Secondary | ICD-10-CM | POA: Diagnosis not present

## 2018-04-05 ENCOUNTER — Other Ambulatory Visit: Payer: Self-pay | Admitting: Endocrinology

## 2018-04-06 DIAGNOSIS — I10 Essential (primary) hypertension: Secondary | ICD-10-CM | POA: Diagnosis not present

## 2018-04-06 DIAGNOSIS — E1169 Type 2 diabetes mellitus with other specified complication: Secondary | ICD-10-CM | POA: Diagnosis not present

## 2018-04-06 DIAGNOSIS — E785 Hyperlipidemia, unspecified: Secondary | ICD-10-CM | POA: Diagnosis not present

## 2018-04-07 DIAGNOSIS — H2513 Age-related nuclear cataract, bilateral: Secondary | ICD-10-CM | POA: Diagnosis not present

## 2018-04-07 DIAGNOSIS — E119 Type 2 diabetes mellitus without complications: Secondary | ICD-10-CM | POA: Diagnosis not present

## 2018-04-07 DIAGNOSIS — H524 Presbyopia: Secondary | ICD-10-CM | POA: Diagnosis not present

## 2018-04-24 ENCOUNTER — Other Ambulatory Visit: Payer: Self-pay | Admitting: Endocrinology

## 2018-04-24 NOTE — Telephone Encounter (Signed)
Please refer request to PCP 

## 2018-04-25 DIAGNOSIS — M0579 Rheumatoid arthritis with rheumatoid factor of multiple sites without organ or systems involvement: Secondary | ICD-10-CM | POA: Diagnosis not present

## 2018-05-02 ENCOUNTER — Other Ambulatory Visit: Payer: Self-pay | Admitting: Endocrinology

## 2018-05-06 DIAGNOSIS — E1169 Type 2 diabetes mellitus with other specified complication: Secondary | ICD-10-CM | POA: Diagnosis not present

## 2018-05-06 DIAGNOSIS — E785 Hyperlipidemia, unspecified: Secondary | ICD-10-CM | POA: Diagnosis not present

## 2018-05-06 DIAGNOSIS — Z794 Long term (current) use of insulin: Secondary | ICD-10-CM | POA: Diagnosis not present

## 2018-05-06 DIAGNOSIS — I10 Essential (primary) hypertension: Secondary | ICD-10-CM | POA: Diagnosis not present

## 2018-05-17 ENCOUNTER — Other Ambulatory Visit: Payer: Self-pay | Admitting: Endocrinology

## 2018-07-30 ENCOUNTER — Other Ambulatory Visit: Payer: Self-pay | Admitting: Endocrinology

## 2018-08-26 ENCOUNTER — Other Ambulatory Visit: Payer: Self-pay | Admitting: Endocrinology

## 2018-10-27 ENCOUNTER — Telehealth: Payer: Self-pay

## 2018-10-27 NOTE — Telephone Encounter (Signed)
LOV 11/23/17. Per Dr. Loanne Drilling, f/u in 3-4 mo. Called pt to schedule appt. LVM requesting returned call.

## 2019-05-12 IMAGING — CR DG CHEST 2V
2 series · 2 of 2 positions shown · non-contrast
Comparison: none

CLINICAL DATA: Pt slipped and fell in shower last night and landed
on left shoulder - having superior left shoulder pain since - also
today began feeling pain on left side of chest (sharp at times) - hx
of CAD, diabetes, htn, heart stent

EXAM:
CHEST - 2 VIEW

[chest pa]
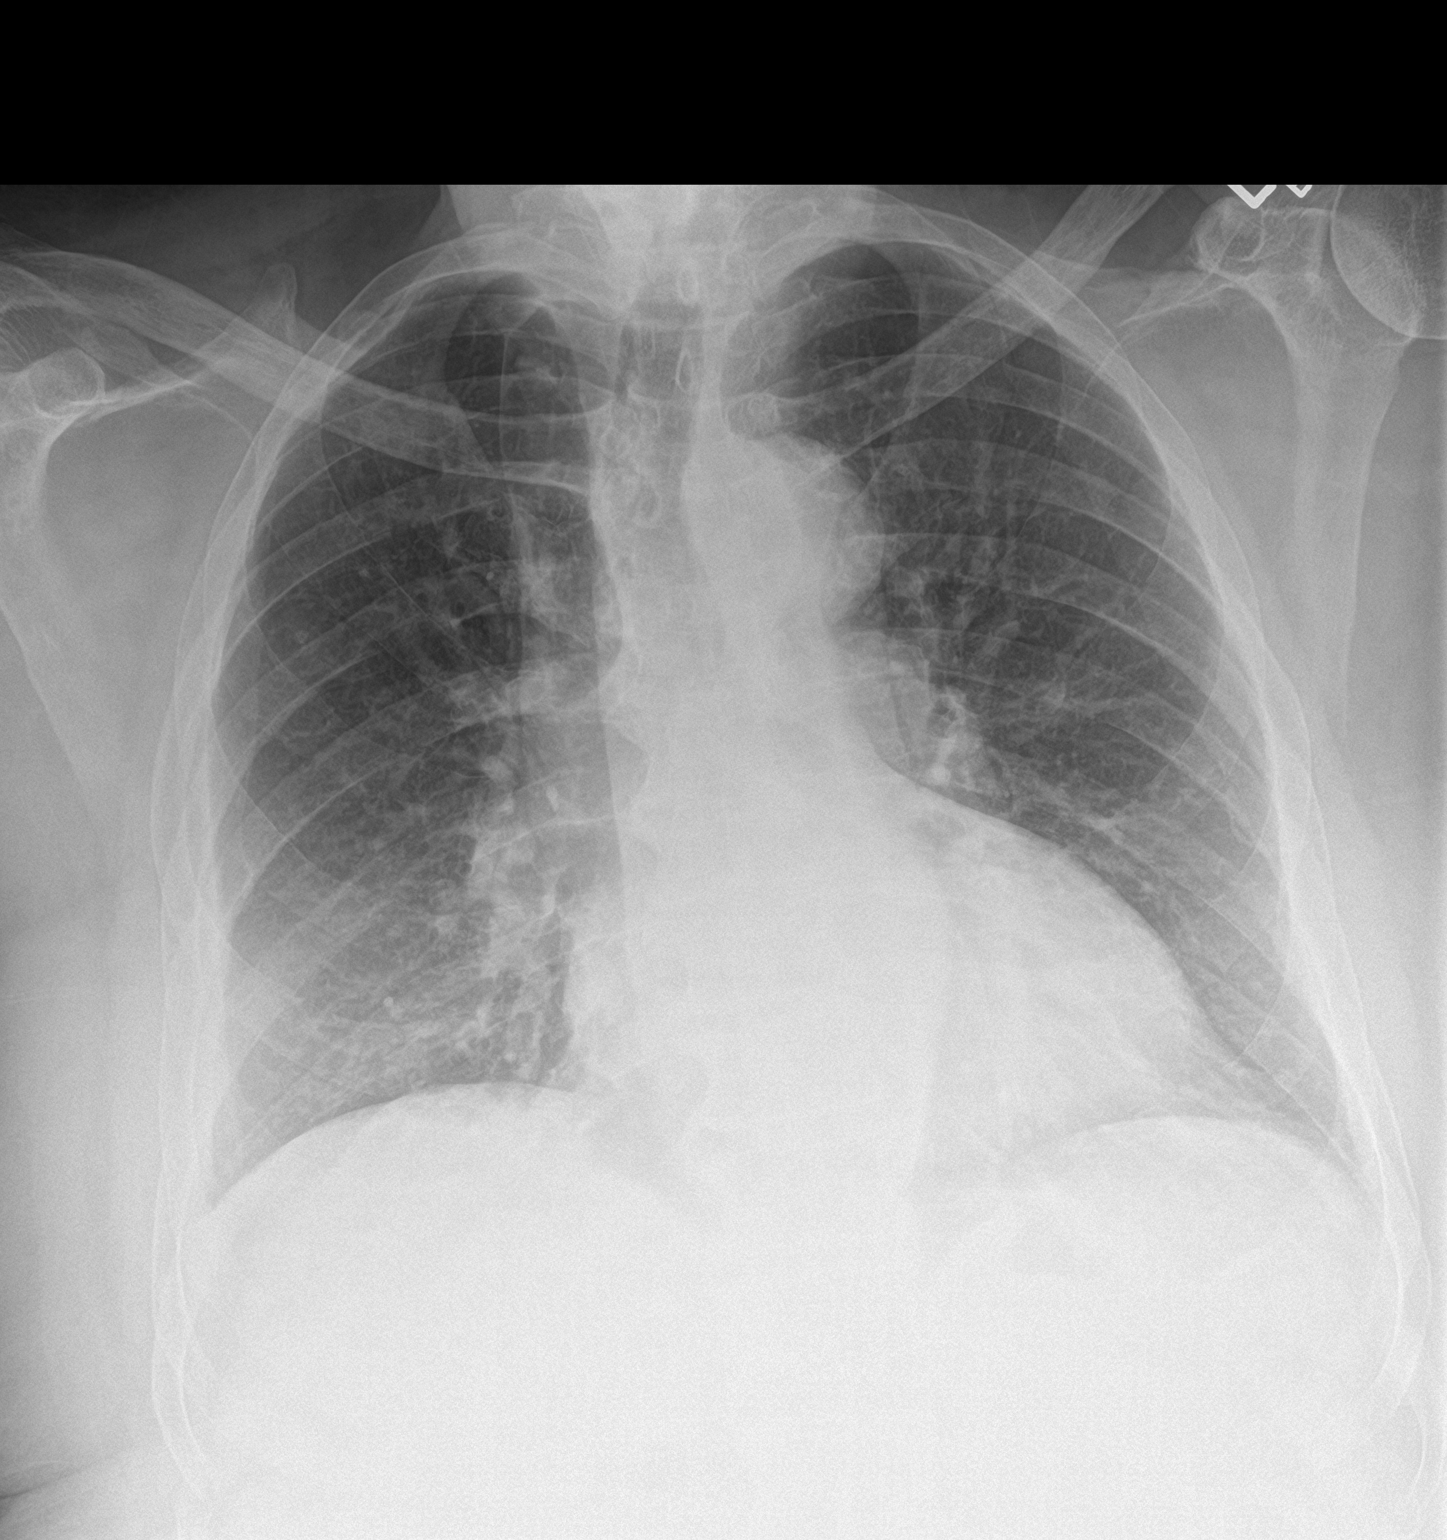

[chest lat]
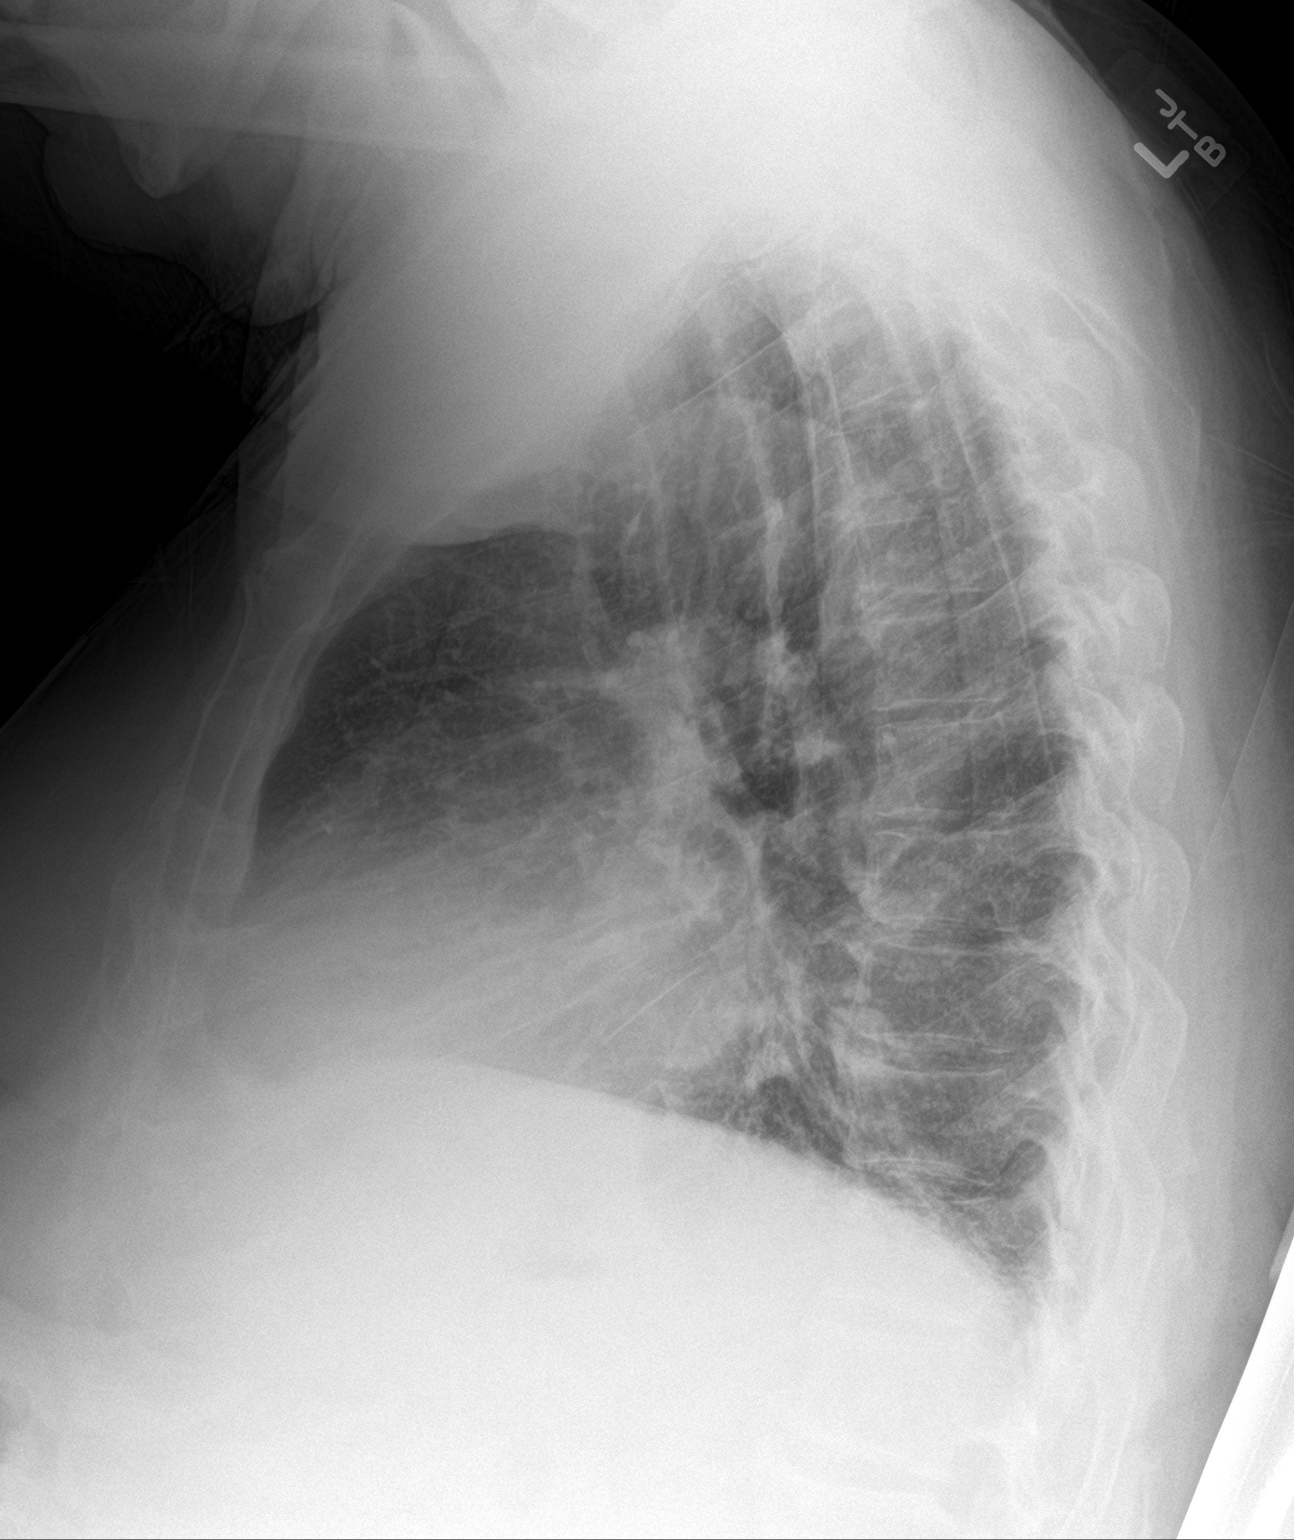

[2 of 2 positions shown; findings below may reference images not displayed]

FINDINGS: Lungs are clear.

Heart size and mediastinal contours are within normal limits.
Tortuous thoracic aorta.

No effusion.

Visualized bones unremarkable.
IMPRESSION: No acute cardiopulmonary disease.

## 2019-05-12 IMAGING — CR DG SHOULDER 2+V*L*
3 series · 3 of 3 positions shown · non-contrast
Comparison: None.

CLINICAL DATA: Pt slipped and fell in shower last night and landed
on left shoulder - having superior left shoulder pain since

EXAM:
LEFT SHOULDER - 2+ VIEW

[shoulder grashey]
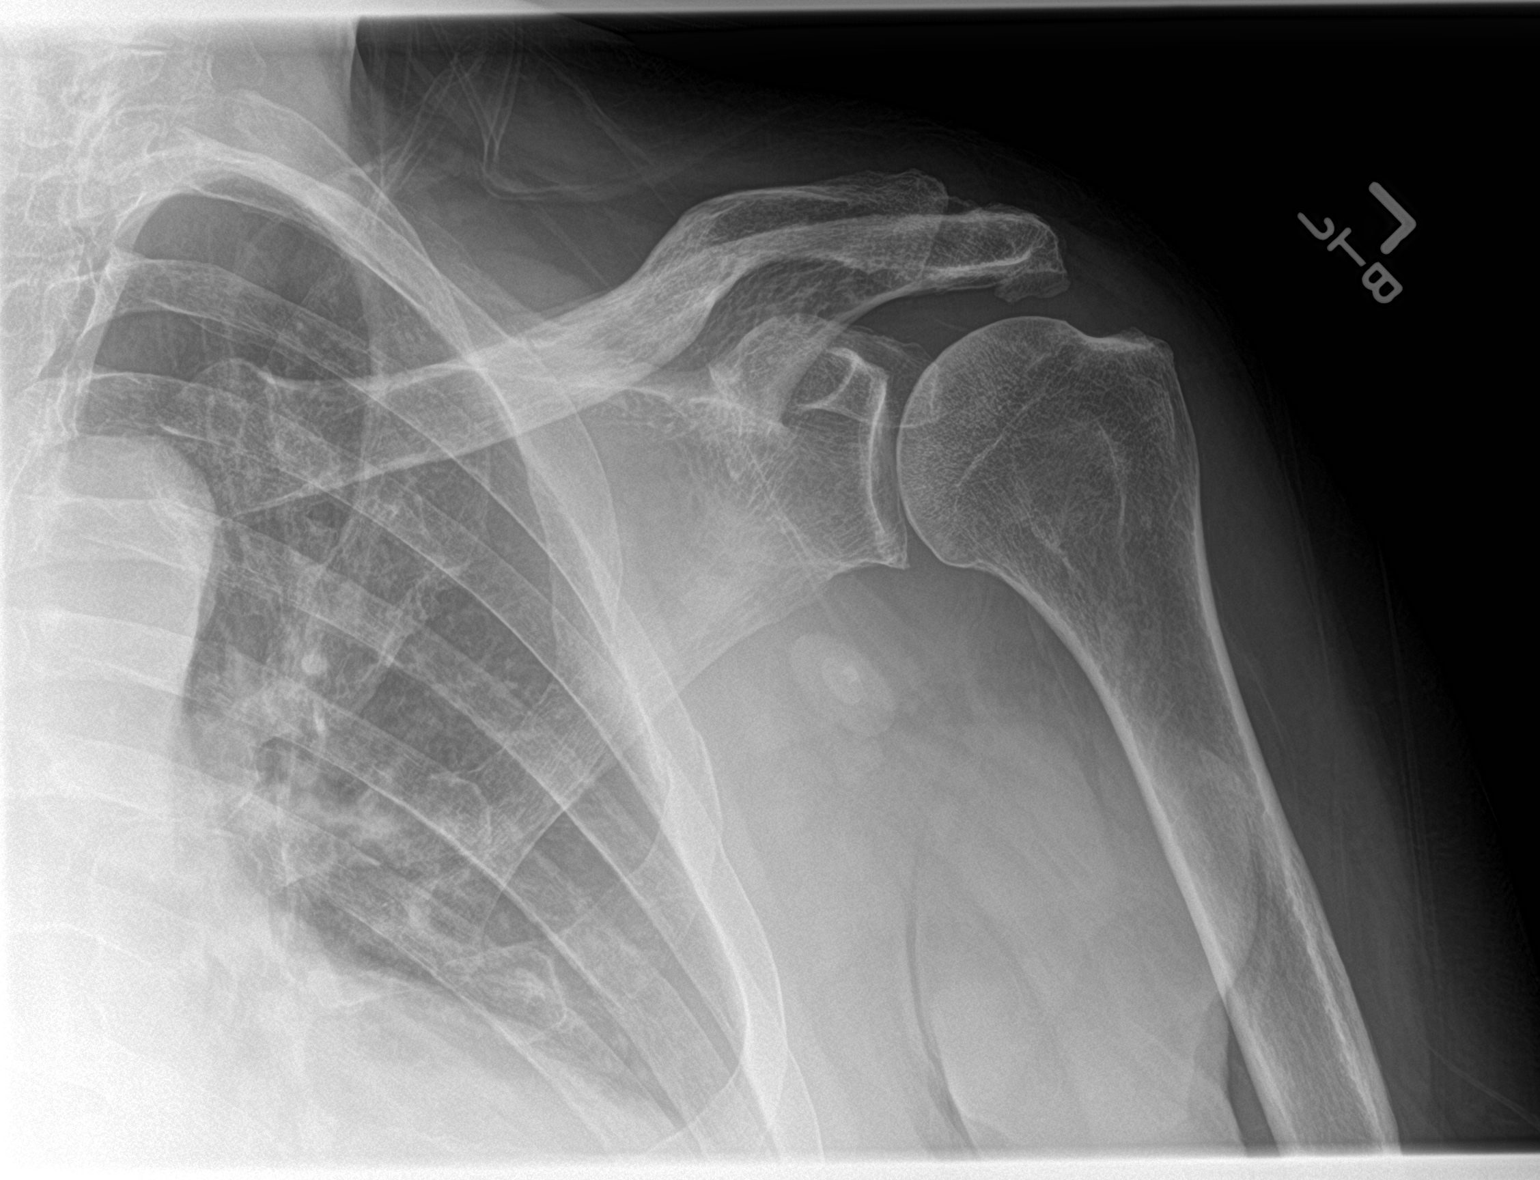

[shoulder y view]
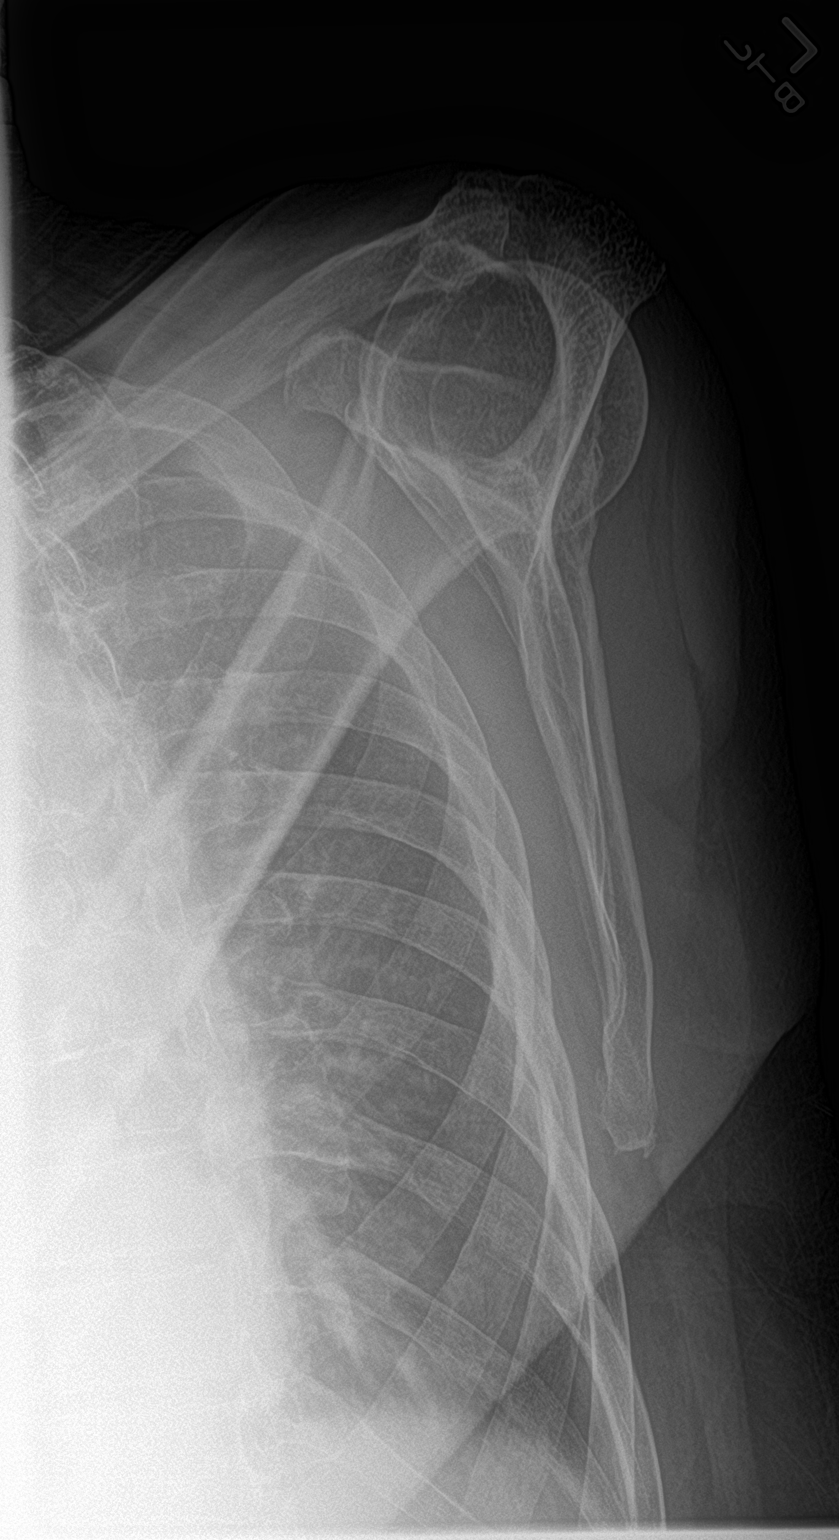

[shoulder axillary]
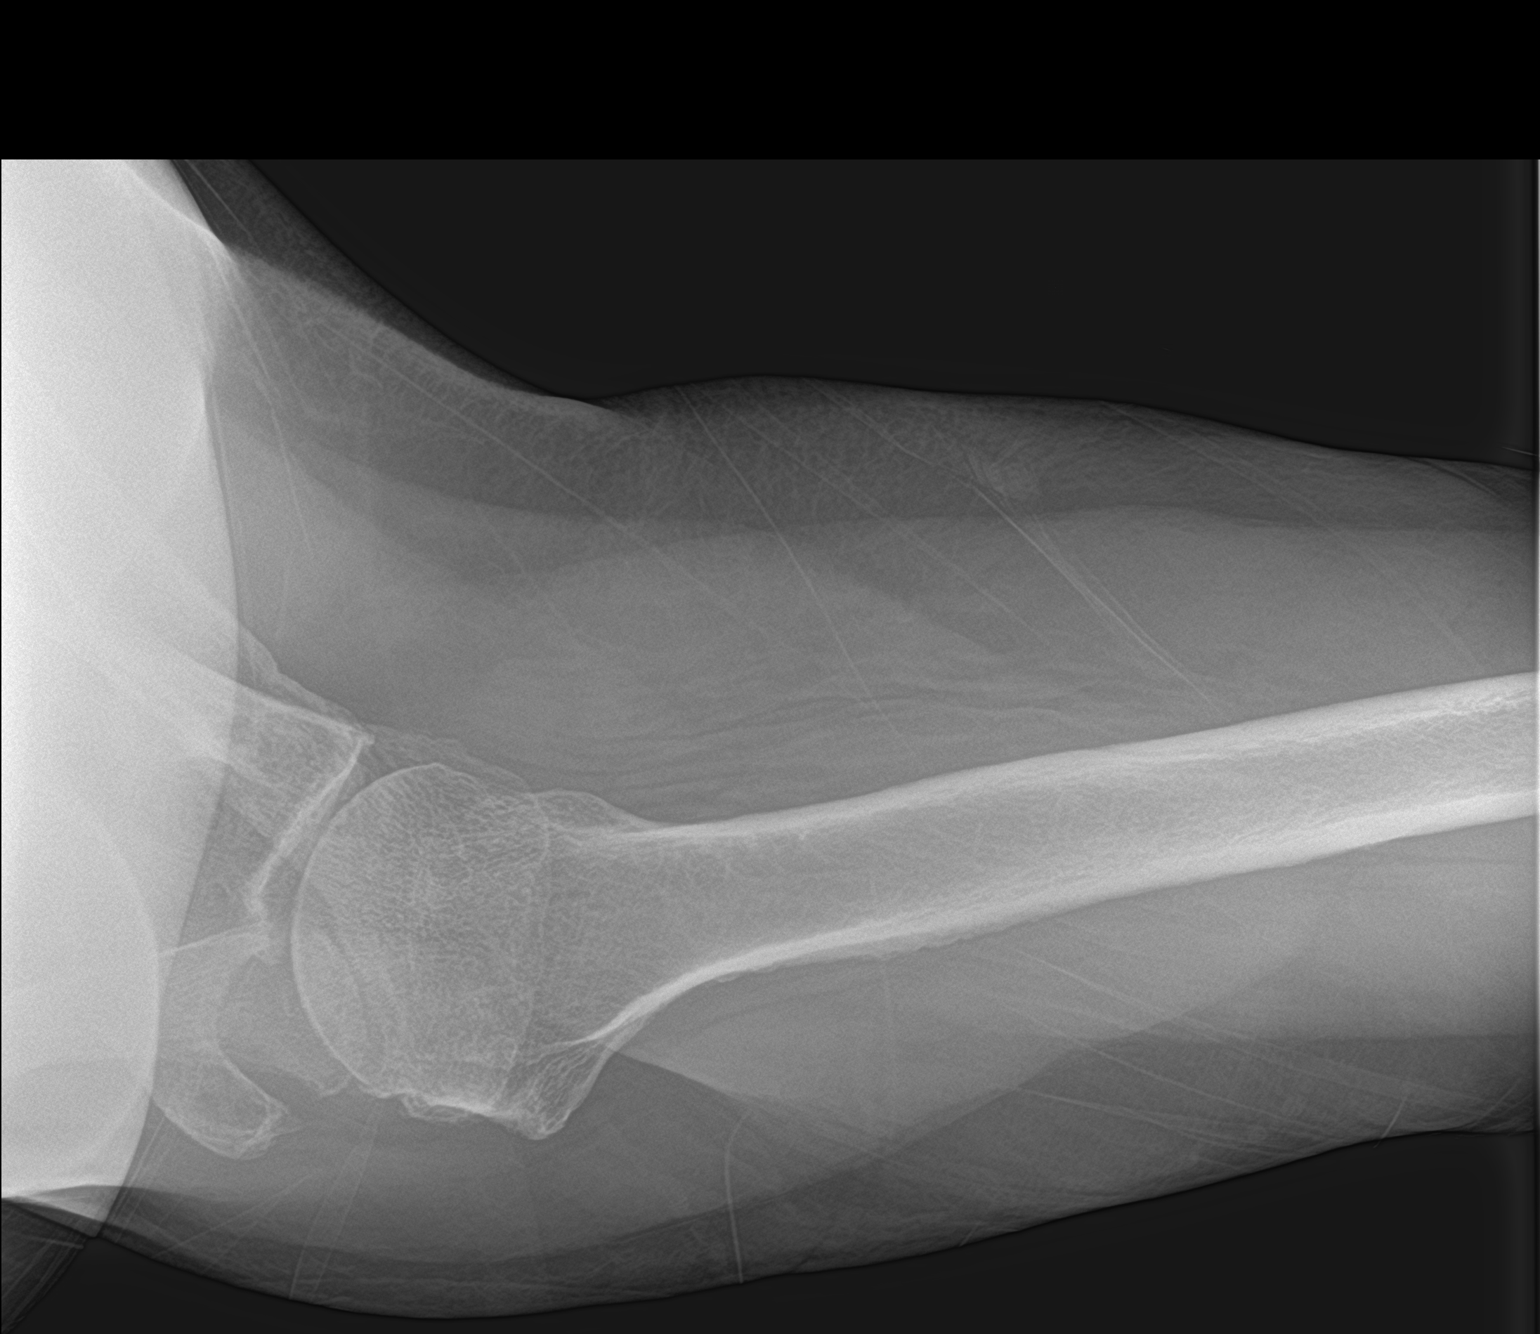

[3 of 3 positions shown; findings below may reference images not displayed]

FINDINGS: There is no evidence of displaced fracture or dislocation. There is
no evidence of arthropathy or other focal bone abnormality. Soft
tissues are unremarkable.
IMPRESSION: Negative.
